# Patient Record
Sex: Female | Born: 1960 | Race: White | Hispanic: No | Marital: Married | Smoking: Never smoker
Health system: Southern US, Community
[De-identification: ages and names within clinical notes are randomized; demographics above are authoritative.]

## PROBLEM LIST (undated history)

## (undated) DIAGNOSIS — M1990 Unspecified osteoarthritis, unspecified site: Secondary | ICD-10-CM

## (undated) DIAGNOSIS — I10 Essential (primary) hypertension: Secondary | ICD-10-CM

## (undated) DIAGNOSIS — K219 Gastro-esophageal reflux disease without esophagitis: Secondary | ICD-10-CM

## (undated) DIAGNOSIS — Z9889 Other specified postprocedural states: Secondary | ICD-10-CM

## (undated) DIAGNOSIS — G43909 Migraine, unspecified, not intractable, without status migrainosus: Secondary | ICD-10-CM

## (undated) DIAGNOSIS — G4730 Sleep apnea, unspecified: Secondary | ICD-10-CM

## (undated) DIAGNOSIS — R112 Nausea with vomiting, unspecified: Secondary | ICD-10-CM

## (undated) HISTORY — PX: SHOULDER ARTHROSCOPY: SHX128

## (undated) HISTORY — PX: CHOLECYSTECTOMY: SHX55

## (undated) HISTORY — PX: ABDOMINAL HYSTERECTOMY: SHX81

## (undated) HISTORY — PX: OTHER SURGICAL HISTORY: SHX169

## (undated) HISTORY — DX: Essential (primary) hypertension: I10

---

## 2001-12-21 ENCOUNTER — Encounter: Admission: RE | Admit: 2001-12-21 | Discharge: 2002-02-09 | Payer: Self-pay | Admitting: Anesthesiology

## 2002-02-15 ENCOUNTER — Encounter: Admission: RE | Admit: 2002-02-15 | Discharge: 2002-05-16 | Payer: Self-pay | Admitting: Anesthesiology

## 2011-05-14 HISTORY — PX: NASAL POLYP SURGERY: SHX186

## 2016-08-26 ENCOUNTER — Other Ambulatory Visit (HOSPITAL_COMMUNITY): Payer: Self-pay | Admitting: General Surgery

## 2016-08-27 ENCOUNTER — Other Ambulatory Visit (HOSPITAL_COMMUNITY): Payer: Self-pay

## 2016-08-27 ENCOUNTER — Ambulatory Visit: Payer: Self-pay | Admitting: Registered"

## 2016-09-11 ENCOUNTER — Encounter: Payer: BC Managed Care – PPO | Attending: General Surgery | Admitting: Registered"

## 2016-09-11 ENCOUNTER — Encounter: Payer: Self-pay | Admitting: Registered"

## 2016-09-11 DIAGNOSIS — Z713 Dietary counseling and surveillance: Secondary | ICD-10-CM | POA: Insufficient documentation

## 2016-09-11 DIAGNOSIS — E669 Obesity, unspecified: Secondary | ICD-10-CM

## 2016-09-11 DIAGNOSIS — Z01818 Encounter for other preprocedural examination: Secondary | ICD-10-CM | POA: Diagnosis not present

## 2016-09-11 DIAGNOSIS — Z6841 Body Mass Index (BMI) 40.0 and over, adult: Secondary | ICD-10-CM | POA: Diagnosis not present

## 2016-09-11 NOTE — Progress Notes (Signed)
Pre-Op Assessment Visit:  Pre-Operative RYGB Surgery  Medical Nutrition Therapy:  Appt start time: 2:05  End time: 3:15  Patient was seen on 09/11/2016 for Pre-Operative Nutrition Assessment. Assessment.   Pt expectation of surgery: live longer, improve joint pain, feel better  Pt expectation of Dietitian: none indicated  Start weight at [facility]: 238.2 BMI: 42.87   Pt states she has IBS issues which makes her nervous about "messing with intestine" related to RYGB and would prefer sleeve gastrectomy. Pt states she is able to go up to 4 days without taking meds for reflux and would prefer to just take meds if it worsens with sleeve gastrectomy. Pt states she is more regular and not having much constipation when she takes reflux meds. Pt states she has a grandson (born in Aug) that stays with her and wants to live longer for him. Pt reports she shares a bowl of popcorn with husband nightly while watching the news. Pt reports having family dinner once a week where she hosts and prepares the food. Pt reports concern of how husband and herself will handle eating different things post-surgery. Pt reports her knees and feet hurt when walking a lot.  Pt is unsure of how many visits needed prior to surgery. Assessment letter needs to be completed.    24 hr Dietary Recall: First Meal: 2 boiled eggs, 2 bacon strips, oatmeal with raisins Snack: apple Second Meal: chicken nuggets, rice, leftovers Snack: apple and peanut butter, banana Third Meal: roast with vegetables, macaroni, potatoes Snack: popcorn, crackers Beverages: flavored water, sweet tea (once a week), coffee with creamer  Encouraged to engage in 150 minutes of moderate physical activity including cardiovascular and weight baring weekly  Handouts given during visit include:  . Pre-Op Goals . Bariatric Surgery Protein Shakes . Vitamin and Mineral Supplement  During the appointment today the following Pre-Op Goals were reviewed with the  patient: . Maintain or lose weight as instructed by your surgeon . Make healthy food choices . Begin to limit portion sizes . Limited concentrated sugars and fried foods . Keep fat/sugar in the single digits per serving on          food labels . Practice CHEWING your food  (aim for 30 chews per bite or until applesauce consistency) . Practice not drinking 15 minutes before, during, and 30 minutes after each meal/snack . Avoid all carbonated beverages  . Avoid/limit caffeinated beverages  . Avoid all sugar-sweetened beverages . Consume 3 meals per day; eat every 3-5 hours . Make a list of non-food related activities . Aim for 64-100 ounces of FLUID daily  . Aim for at least 60-80 grams of PROTEIN daily . Look for a liquid protein source that contain ?15 g protein and ?5 g carbohydrate  (ex: shakes, drinks, shots)  Follow diet recommendations listed below  Energy and Macronutrient Recomendations: Calories: 1500 Carbohydrate: 170 Protein: 112 Fat: 42  Demonstrated degree of understanding via:  Teach Back   Teaching Method Utilized:  Visual Auditory Hands on  Barriers to learning/adherence to lifestyle change: none  Patient to call the [facility] to enroll in Pre-Op and Post-Op Nutrition Education when surgery date is scheduled.

## 2016-09-27 ENCOUNTER — Other Ambulatory Visit (HOSPITAL_COMMUNITY): Payer: Self-pay | Admitting: General Surgery

## 2016-10-04 ENCOUNTER — Ambulatory Visit (HOSPITAL_COMMUNITY)
Admission: RE | Admit: 2016-10-04 | Discharge: 2016-10-04 | Disposition: A | Discharge status: Home / Self Care | Payer: BC Managed Care – PPO | Source: Ambulatory Visit | Attending: General Surgery | Admitting: General Surgery

## 2016-10-04 ENCOUNTER — Encounter (HOSPITAL_COMMUNITY): Payer: Self-pay | Admitting: Radiology

## 2016-10-04 DIAGNOSIS — Z01818 Encounter for other preprocedural examination: Secondary | ICD-10-CM | POA: Diagnosis present

## 2016-10-04 DIAGNOSIS — Z01810 Encounter for preprocedural cardiovascular examination: Secondary | ICD-10-CM | POA: Diagnosis not present

## 2016-10-14 ENCOUNTER — Encounter: Payer: Self-pay | Admitting: Registered"

## 2016-10-14 ENCOUNTER — Encounter: Payer: BC Managed Care – PPO | Attending: General Surgery | Admitting: Registered"

## 2016-10-14 DIAGNOSIS — Z6841 Body Mass Index (BMI) 40.0 and over, adult: Secondary | ICD-10-CM | POA: Insufficient documentation

## 2016-10-14 DIAGNOSIS — Z01818 Encounter for other preprocedural examination: Secondary | ICD-10-CM | POA: Diagnosis present

## 2016-10-14 DIAGNOSIS — Z713 Dietary counseling and surveillance: Secondary | ICD-10-CM | POA: Insufficient documentation

## 2016-10-14 DIAGNOSIS — E669 Obesity, unspecified: Secondary | ICD-10-CM

## 2016-10-14 NOTE — Patient Instructions (Addendum)
-   Aim for at least 30 minutes of physical activity 5 days/week.

## 2016-10-14 NOTE — Progress Notes (Signed)
Appt start time: 4:45 end time: 5:19  Assessment: 1st SWL Appointment.   Start Wt at [facility]: 238.2 Wt: 236.0 BMI: 42.48   Pt arrives having lost 2.2 lbs from previous visit. Pt states she has stopped taking Tramadol routinely and uses as needed. Pt states she has has switched to decaf coffee and has eliminated using sweeteners. Pt reports she has tried premier protein caramel with coffee and it is tolerable. Pt reports she has also tried chocolate and strawberry protein shakes. Pt states she is still unsure of if she wants to have the RYGB due to the involvement of her intestines and having history of IBS. Pt states she hopes she is able to speak with surgeon again prior to express her concerns.   Pt states that this is her only SWL visit needed with us prior to surgery. Assessment letter faxed today 10/14/2016.   MEDICATIONS: See list   DIETARY INTAKE:  24-hr recall:  B ( AM): 2 boiled eggs, 4 strips of bacon  Snk ( AM): fresh fruit, greek yogurt or protein shake L ( PM): chicken nuggets and salad w/ lite dressing Snk ( PM): none D ( PM): pizza or hotdog w/o bun or meat and potatoes Snk ( PM): popcorn  Beverages: decaf coffee, water w/ replenish flavor  Usual physical activity: Walking 3-4x/week with grandson  Diet to Follow: Calories: 1500 Carbohydrate: 170 Protein: 112 Fat: 42  Preferred Learning Style:   No preference indicated   Learning Readiness:   Ready  Change in progress   Nutritional Diagnosis:  [place]-3.3 Overweight/obesity related to past poor dietary habits and physical inactivity as evidenced by patient w/ planned RYGB surgery following dietary guidelines for continued weight loss.    Intervention:  Nutrition counseling for upcoming Bariatric Surgery.  Goals:  - Aim for at least 30 minutes of physical activity 5 days/week.  Teaching Method Utilized:  Visual Auditory  Handouts given during visit include:  Arm exercises  Barriers to  learning/adherence to lifestyle change: none  Demonstrated degree of understanding via:  Teach Back   Monitoring/Evaluation:  Dietary intake, exercise, and body weight prn.

## 2016-11-04 ENCOUNTER — Encounter: Payer: BC Managed Care – PPO | Admitting: Skilled Nursing Facility1

## 2016-11-04 DIAGNOSIS — Z01818 Encounter for other preprocedural examination: Secondary | ICD-10-CM | POA: Diagnosis not present

## 2016-11-04 DIAGNOSIS — E119 Type 2 diabetes mellitus without complications: Secondary | ICD-10-CM

## 2016-11-06 NOTE — Progress Notes (Signed)
  Pre-Operative Nutrition Class:  Appt start time: 1583   End time:  1830.  Patient was seen on 11/04/16 for Pre-Operative Bariatric Surgery Education [facility].   Surgery date:  Surgery type: RYGB Start weight at [facility]: 238.2 Weight today: Pt denied  TANITA  BODY COMP RESULTS     BMI (kg/m^2)    Fat Mass (lbs)    Fat Free Mass (lbs)    Total Body Water (lbs)    Samples given per MNT protocol. Patient educated on appropriate usage: Bariatric Advantage Multivitamin Lot # [id number] Exp: 06/19  Bariatric Fusion Calcium Citrate Lot # [id number] Exp: 09/11/17  Premier Clear Protein Drink Lot # [id number] Exp: 04/08/17  The following the learning objectives were met by the patient during this course:  Identify Pre-Op Dietary Goals and will begin 2 weeks pre-operatively  Identify appropriate sources of fluids and proteins   State protein recommendations and appropriate sources pre and post-operatively  Identify Post-Operative Dietary Goals and will follow for 2 weeks post-operatively  Identify appropriate multivitamin and calcium sources  Describe the need for physical activity post-operatively and will follow MD recommendations  State when to call healthcare provider regarding medication questions or post-operative complications  Handouts given during class include:  Pre-Op Bariatric Surgery Diet Handout  Protein Shake Handout  Post-Op Bariatric Surgery Nutrition Handout  BELT Program Information Flyer  Support Group Information Flyer  [facility] Bariatric Supplements Price List  Follow-Up Plan: Patient will follow-up at [facility] 2 weeks post operatively for diet advancement per MD.

## 2016-11-19 ENCOUNTER — Telehealth: Payer: Self-pay | Admitting: Skilled Nursing Facility1

## 2016-11-20 ENCOUNTER — Encounter: Payer: Self-pay | Admitting: Skilled Nursing Facility1

## 2016-11-20 ENCOUNTER — Encounter: Payer: BC Managed Care – PPO | Attending: General Surgery | Admitting: Skilled Nursing Facility1

## 2016-11-20 DIAGNOSIS — Z6841 Body Mass Index (BMI) 40.0 and over, adult: Secondary | ICD-10-CM | POA: Diagnosis not present

## 2016-11-20 DIAGNOSIS — E669 Obesity, unspecified: Secondary | ICD-10-CM

## 2016-11-20 DIAGNOSIS — Z713 Dietary counseling and surveillance: Secondary | ICD-10-CM | POA: Insufficient documentation

## 2016-11-20 DIAGNOSIS — Z01818 Encounter for other preprocedural examination: Secondary | ICD-10-CM | POA: Diagnosis present

## 2016-11-20 NOTE — Progress Notes (Signed)
Appt start time: 4:45 end time: 5:19  Assessment: 2nd SWL Appointment.  Pt arrives having lost about 3 pounds since her last visit. Pt arrives with her daughter and her grandson who she states she is having surgery for. Pt states she has started taking the celebrate multivitamin. Pt states she has tried different protein shakes and got the [employer] protein water to try. Pt states she has been reading the weight loss for dummies book. Pt states her preparing for the surgery and getting the surgery is a benefit for the whole family and states her husband has high tryliglycerdies so he will benefit from the changes she has to make for surgery. Pt states she would not have gone through with getting the surgery if she did not have support from her husband which she does. Pts behavior changes: cut out caffiene, walking more, watching her portions sizes, cooking healthier. Pt states she is mentally prepared and emotionally ready to have bariatric surgery.  Pts daughter and the pt were able to teach the dietitian about the lifestyle habits that need to take place in order to have positive outcomes after the surgery.  After meeting with the pt dietitian is confident the pt will reach her weight loss goals in a healthy and sustainable way once her weight loss surgery has been conducted.   [facility] at [facility]: 238.2 Weight today: 233   MEDICATIONS: See list   DIETARY INTAKE:  24-hr recall:  B ( AM): 2 boiled eggs, 4 strips of bacon  Snk ( AM): fresh fruit, greek yogurt or protein shake L ( PM): chicken nuggets and salad w/ lite dressing Snk ( PM): none D ( PM): pizza or hotdog w/o bun or meat and potatoes Snk ( PM): popcorn  Beverages: decaf coffee, water w/ replenish flavor  Usual physical activity: Walking 3-4x/week with grandson  Diet to Follow: Calories: 1500 Carbohydrate: 170 Protein: 112 Fat: 42  Preferred Learning Style:   No preference indicated   Learning Readiness:    Ready  Change in progress   Nutritional Diagnosis:  [place]-3.3 Overweight/obesity related to past poor dietary habits and physical inactivity as evidenced by patient w/ planned RYGB surgery following dietary guidelines for continued weight loss.    Intervention:  Nutrition counseling for upcoming Bariatric Surgery.  Goals:  - Aim for at least 30 minutes of physical activity 5 days/week.  Teaching Method Utilized:  Visual Auditory  Handouts given during visit include:  Arm exercises  Barriers to learning/adherence to lifestyle change: none  Demonstrated degree of understanding via:  Teach Back   Monitoring/Evaluation:  Dietary intake, exercise, and body weight prn.

## 2016-11-20 NOTE — Telephone Encounter (Signed)
Done in error.

## 2016-12-03 ENCOUNTER — Encounter (HOSPITAL_COMMUNITY): Payer: Self-pay

## 2016-12-03 NOTE — Progress Notes (Signed)
Please place orders in epic, patient has pre-surgical testing appt on 12-04-16. Thank you

## 2016-12-03 NOTE — Patient Instructions (Signed)
Tobie Poeteresa Ogden Ninh  12/03/2016   Your procedure is scheduled on: 12-10-16  [facility]  Entrance [place]  elevators to 3rd floor to  Short Stay Center at 1015AM.    Call this number if you have problems the morning of surgery [phone]    Remember: ONLY 1 PERSON MAY GO WITH YOU TO SHORT STAY TO GET  READY MORNING OF YOUR SURGERY.  Do not eat food or drink liquids :After Midnight.     Take these medicines the morning of surgery with A SIP OF WATER: omeprazole(prilosec)                                You may not have any metal on your body including hair pins and              piercings  Do not wear jewelry, make-up, lotions, powders or perfumes, deodorant             Do not wear nail polish.  Do not shave  48 hours prior to surgery.            Do not bring valuables to the hospital. [facility] IS NOT             RESPONSIBLE   FOR VALUABLES.  Contacts, dentures or bridgework may not be worn into surgery.  Leave suitcase in the car. After surgery it may be brought to your room.                Please read over the following fact sheets you were given: _____________________________________________________________________           [facility] - Preparing for Surgery Before surgery, you can play an important role.  Because skin is not sterile, your skin needs to be as free of germs as possible.  You can reduce the number of germs on your skin by washing with CHG (chlorahexidine gluconate) soap before surgery.  CHG is an antiseptic cleaner which kills germs and bonds with the skin to continue killing germs even after washing. Please DO NOT use if you have an allergy to CHG or antibacterial soaps.  If your skin becomes reddened/irritated stop using the CHG and inform your nurse when you arrive at Short Stay. Do not shave (including legs and underarms) for at least 48 hours prior to the first CHG shower.  You may shave your face/neck. Please follow  these instructions carefully:  1.  Shower with CHG Soap the night before surgery and the  morning of Surgery.  2.  If you choose to wash your hair, wash your hair first as usual with your  normal  shampoo.  3.  After you shampoo, rinse your hair and body thoroughly to remove the  shampoo.                           4.  Use CHG as you would any other liquid soap.  You can apply chg directly  to the skin and wash                       Gently with a scrungie or clean washcloth.  5.  Apply the CHG Soap to your body ONLY FROM THE NECK DOWN.   Do not  use on face/ open                           Wound or open sores. Avoid contact with eyes, ears mouth and genitals (private parts).                       Wash face,  Genitals (private parts) with your normal soap.             6.  Wash thoroughly, paying special attention to the area where your surgery  will be performed.  7.  Thoroughly rinse your body with warm water from the neck down.  8.  DO NOT shower/wash with your normal soap after using and rinsing off  the CHG Soap.                9.  Pat yourself dry with a clean towel.            10.  Wear clean pajamas.            11.  Place clean sheets on your bed the night of your first shower and do not  sleep with pets. Day of Surgery : Do not apply any lotions/deodorants the morning of surgery.  Please wear clean clothes to the hospital/surgery center.  FAILURE TO FOLLOW THESE INSTRUCTIONS MAY RESULT IN THE CANCELLATION OF YOUR SURGERY PATIENT SIGNATURE_________________________________  NURSE SIGNATURE__________________________________  ________________________________________________________________________

## 2016-12-03 NOTE — Progress Notes (Signed)
LOV Dr Haze RushingYbanez 10-28-16 epic (care everywhere) " Dr mentions awareness of upcoming bariatric surgery   EKG 10-04-16 epic   CXR 10-04-16 epic

## 2016-12-04 ENCOUNTER — Encounter (HOSPITAL_COMMUNITY)
Admission: RE | Admit: 2016-12-04 | Discharge: 2016-12-04 | Disposition: A | Discharge status: Home / Self Care | Payer: BC Managed Care – PPO | Source: Ambulatory Visit | Attending: General Surgery | Admitting: General Surgery

## 2016-12-04 ENCOUNTER — Encounter (HOSPITAL_COMMUNITY): Payer: Self-pay

## 2016-12-04 DIAGNOSIS — Z01818 Encounter for other preprocedural examination: Secondary | ICD-10-CM | POA: Insufficient documentation

## 2016-12-04 HISTORY — DX: Other specified postprocedural states: Z98.890

## 2016-12-04 HISTORY — DX: Gastro-esophageal reflux disease without esophagitis: K21.9

## 2016-12-04 HISTORY — DX: Migraine, unspecified, not intractable, without status migrainosus: G43.909

## 2016-12-04 HISTORY — DX: Sleep apnea, unspecified: G47.30

## 2016-12-04 HISTORY — DX: Unspecified osteoarthritis, unspecified site: M19.90

## 2016-12-04 HISTORY — DX: Nausea with vomiting, unspecified: R11.2

## 2016-12-04 LAB — CBC
HCT: 39.8 % (ref 36.0–46.0)
HEMOGLOBIN: 13.9 g/dL (ref 12.0–15.0)
MCH: 28.7 pg (ref 26.0–34.0)
MCHC: 34.9 g/dL (ref 30.0–36.0)
MCV: 82.2 fL (ref 78.0–100.0)
Platelets: 205 10*3/uL (ref 150–400)
RBC: 4.84 MIL/uL (ref 3.87–5.11)
RDW: 14.5 % (ref 11.5–15.5)
WBC: 7.2 10*3/uL (ref 4.0–10.5)

## 2016-12-04 LAB — BASIC METABOLIC PANEL
ANION GAP: 8 (ref 5–15)
BUN: 23 mg/dL — ABNORMAL HIGH (ref 6–20)
CALCIUM: 9.2 mg/dL (ref 8.9–10.3)
CHLORIDE: 101 mmol/L (ref 101–111)
CO2: 27 mmol/L (ref 22–32)
Creatinine, Ser: 0.65 mg/dL (ref 0.44–1.00)
GFR calc Af Amer: >60 mL/min (ref >60)
GFR calc non Af Amer: >60 mL/min (ref >60)
GLUCOSE: 89 mg/dL (ref 65–99)
Potassium: 3.7 mmol/L (ref 3.5–5.1)
Sodium: 136 mmol/L (ref 135–145)

## 2016-12-09 NOTE — H&P (Signed)
History of Present Illness Savannah Zamora(Rhodes Calvert A. Hera Celaya MD; 11/21/2016 1:40 PM) The patient is a 56 year old female who presents for a bariatric surgery evaluation. Initial presentation included inability to lose weight. Current diet includes well balanced meals (high protein breakfast with eggs or protein shake, grilled chicken with vegetable for lunch, water as only drink, she has eliminated straws, creamer, caffeine, and carbonation while working through this process.). a daily (takes care of 28mo old with frequent walking.). The patient is currently able to do activities of daily living with limitations (knee and ankle pain, she takes occasional NSAIDs and tylenol). Past treatment has included low calorie diet, low fat diet, diabetic diet and exercise regimen. Surgical history: C-section - elective, cholecystectomy and hysterectomy. Psychiatric History: The patient has depression (treated in the past, no active medications or issues), and patient denies bipolar disorder, anxiety or psychiatric hospitalizations. Cardiac history: The patient denies history of angina, history of heart attack, history of stroke, pulmonary embolus, sleep apnea, aspirin, anti-platelets, anti-coagulants or smoking. Gastrointestinal History: Patient has heartburn (on PPI with no breakthrough symptoms.), and denies dysphagia or irritable bowel disease. MBSQIP recognized comorbidities: Patient has hypertension (on 1 combo pill) and gastroesophageal reflux disease (on PPI), and denies hyperlipidemia, diabetes mellitus or sleep apnea. She has a very supportive spouse and daughter as well as good community of support. She also has been in touch with 2 friends who have undergone the procedure and has received a lot of support from them.   Allergies Christianne Dolin(Christen Lambert, RMA; 11/21/2016 11:22 AM) No Known Allergies 08/02/2016  Medication History Christianne Dolin(Christen Lambert, [place]; 11/21/2016 11:22 AM) Complete Multi-Vitamin (Oral) Active. (with  iron) LORazepam (1MG  Tablet, Oral) Active. Omeprazole (20MG  Capsule DR, Oral) Active. Valsartan-Hydrochlorothiazide (160-12.5MG  Tablet, Oral) Active. Rizatriptan Benzoate (10MG  Tablet, Oral) Active. Medications Reconciled    Review of Systems Savannah Zamora(Savannah Bontempo A. Uri Covey MD; 11/21/2016 1:40 PM) General Not Present- Appetite Loss, Chills, Fatigue, Fever, Night Sweats, Weight Gain and Weight Loss. Skin Present- Dryness. Not Present- Change in Wart/Mole, Hives, Jaundice, New Lesions, Non-Healing Wounds, Rash and Ulcer. HEENT Present- Wears glasses/contact lenses. Not Present- Earache, Hearing Loss, Hoarseness, Nose Bleed, Oral Ulcers, Ringing in the Ears, Seasonal Allergies, Sinus Pain, Sore Throat, Visual Disturbances and Yellow Eyes. Respiratory Present- Snoring. Not Present- Bloody sputum, Chronic Cough, Difficulty Breathing and Wheezing. Breast Not Present- Breast Mass, Breast Pain, Nipple Discharge and Skin Changes. Cardiovascular Not Present- Chest Pain, Difficulty Breathing Lying Down, Leg Cramps, Palpitations, Rapid Heart Rate, Shortness of Breath and Swelling of Extremities. Gastrointestinal Present- Indigestion. Not Present- Abdominal Pain, Bloating, Bloody Stool, Change in Bowel Habits, Chronic diarrhea, Constipation, Difficulty Swallowing, Excessive gas, Gets full quickly at meals, Hemorrhoids, Nausea, Rectal Pain and Vomiting. Female Genitourinary Not Present- Frequency, Nocturia, Painful Urination, Pelvic Pain and Urgency. Musculoskeletal Present- Back Pain, Joint Pain and Joint Stiffness. Not Present- Muscle Pain, Muscle Weakness and Swelling of Extremities. Neurological Present- Headaches. Not Present- Decreased Memory, Fainting, Numbness, Seizures, Tingling, Tremor, Trouble walking and Weakness. Psychiatric Present- Anxiety. Not Present- Bipolar, Change in Sleep Pattern, Depression, Fearful and Frequent crying. Endocrine Present- Hot flashes. Not Present- Cold Intolerance, Excessive  Hunger, Hair Changes, Heat Intolerance and New Diabetes. Hematology Not Present- Blood Thinners, Easy Bruising, Excessive bleeding, Gland problems, HIV and Persistent Infections.  Vitals Christianne Dolin(Christen Lambert RMA; 11/21/2016 11:23 AM) 11/21/2016 11:22 AM Weight: 233 lb Height: 64in Body Surface Area: 2.09 m Body Mass Index: 39.99 kg/m  Temp.: 107F  Pulse: 90 (Regular)  BP: 132/76 (Sitting, Left Arm, Standard)  Physical Exam Savannah Carl MD; 11/21/2016 1:40 PM) General Mental Status-Alert. General Appearance-Cooperative. Orientation-Oriented X4. Build & Nutrition-Obese. Posture-Normal posture.  Integumentary Global Assessment Normal Exam - Head/Face: no rashes, ulcers, lesions or evidence of photo damage. No palpable nodules or masses and Neck: no visible lesions or palpable masses.  Head and Neck Head-normocephalic, atraumatic with no lesions or palpable masses. Face Global Assessment - atraumatic. Thyroid Gland Characteristics - normal size and consistency.  Eye Eyeball - Bilateral-Extraocular movements intact. Sclera/Conjunctiva - Bilateral-No scleral icterus, No Discharge.  ENMT Nose and Sinuses Nose - no deformities observed, no swelling present.  Chest and Lung Exam Palpation Normal exam - Non-tender. Auscultation Breath sounds - Normal.  Cardiovascular Auscultation Rhythm - Regular. Heart Sounds - S1 WNL and S2 WNL. Carotid arteries - No Carotid bruit.  Abdomen Inspection Normal Exam - No Visible peristalsis, No Abnormal pulsations and No Paradoxical movements. Palpation/Percussion Normal exam - Soft, Non Tender, No Rebound tenderness, No Rigidity (guarding), No hepatosplenomegaly and No Palpable abdominal masses. Note: well healed laparoscopic scars   Peripheral Vascular Upper Extremity Palpation - Pulses bilaterally normal. Lower Extremity Palpation - Edema - Bilateral - No edema.  Neurologic Neurologic  evaluation reveals -normal sensation and normal coordination.  Neuropsychiatric Mental status exam performed with findings of-able to articulate well with normal speech/language, rate, volume and coherence and thought content normal with ability to perform basic computations and apply abstract reasoning.  Musculoskeletal Normal Exam - Bilateral-Upper Extremity Strength Normal and Lower Extremity Strength Normal.    Assessment & Plan Savannah Carl MD; 11/21/2016 1:46 PM) MORBID OBESITY (E66.01) Story: 56 yo female with morbid obesity as well as obstructive sleep apnea, hypertension and GERD. SHe is most interested in a sleeve gastrectomy. I think given her reflux maintained on PPI and positive hiatal hernia on UGI that this is an ideal procedure for her and we will repair the hiatal hernia at time of surgery. She has shown marked improvement of diet in the preoperative phase and is ready to proceed with surgery. Impression: We discussed LSG. We discussed the preoperative, operative and postoperative process. I explained the surgery in detail including 6 small incisions, ligation of the greater curve gastric vessels, placement of 40Fr Bougie and stapling 80% of the volume of the stomach and paying attention to the angularis incisura to maintain ideal postoperative anatomy and the performance of an EGD near the end of the surgery to test for leak. We discussed the typical hospital course including a 1-2 day stay baring any complications. The patient was given educational material. I quoted the patient that most patients can lose up to 50-70% of their excess weight. We did discuss the possibility of weight regain several years after the procedure.  The risks of infection, bleeding, pain, scarring, weight regain, too little or too much weight loss, vitamin deficiencies and need for lifelong vitamin supplementation, hair loss, need for protein supplementation, leaks, stricture, reflux, food  intolerance, gallstone formation, hernia, need for reoperation, need for open surgery, injury to spleen or surrounding structures, DVT's, PE, and death again discussed with the patient and the patient expressed understanding and desires to proceed with laparoscopic sleeve gastrectomy, possible open, intraoperative endoscopy.  We discussed that before and after surgery that there would be an alteration in their diet. I explained that we have put them on a diet 2 weeks before surgery. I also explained that they would be on a liquid diet for 2 weeks after surgery. We discussed that they would have  to avoid certain foods after surgery. We discussed the importance of physical activity as well as compliance with our dietary and supplement recommendations and routine follow-up. HYPERTENSION, BENIGN (I10) Impression: on 1 combo med GASTROESOPHAGEAL REFLUX DISEASE, ESOPHAGITIS PRESENCE NOT SPECIFIED (K21.9) Impression: on PPI with no breakthrough symptoms

## 2016-12-09 NOTE — Progress Notes (Signed)
Called [facility] triage desk requesting orders for 12/10/16 surgery

## 2016-12-10 ENCOUNTER — Inpatient Hospital Stay (HOSPITAL_COMMUNITY): Payer: BC Managed Care – PPO | Admitting: Certified Registered Nurse Anesthetist

## 2016-12-10 ENCOUNTER — Encounter (HOSPITAL_COMMUNITY)
Admission: RE | Disposition: A | Discharge status: Home / Self Care | Payer: Self-pay | Source: Ambulatory Visit | Attending: General Surgery

## 2016-12-10 ENCOUNTER — Encounter (HOSPITAL_COMMUNITY): Payer: Self-pay | Admitting: *Deleted

## 2016-12-10 ENCOUNTER — Inpatient Hospital Stay (HOSPITAL_COMMUNITY)
Admission: RE | Admit: 2016-12-10 | Discharge: 2016-12-11 | DRG: 621 | Disposition: A | Discharge status: Home / Self Care | Payer: BC Managed Care – PPO | Source: Ambulatory Visit | Attending: General Surgery | Admitting: General Surgery

## 2016-12-10 DIAGNOSIS — K210 Gastro-esophageal reflux disease with esophagitis: Secondary | ICD-10-CM | POA: Diagnosis present

## 2016-12-10 DIAGNOSIS — F329 Major depressive disorder, single episode, unspecified: Secondary | ICD-10-CM | POA: Diagnosis present

## 2016-12-10 DIAGNOSIS — K449 Diaphragmatic hernia without obstruction or gangrene: Secondary | ICD-10-CM | POA: Diagnosis present

## 2016-12-10 DIAGNOSIS — R12 Heartburn: Secondary | ICD-10-CM | POA: Diagnosis present

## 2016-12-10 DIAGNOSIS — G4733 Obstructive sleep apnea (adult) (pediatric): Secondary | ICD-10-CM | POA: Diagnosis present

## 2016-12-10 DIAGNOSIS — Z6841 Body Mass Index (BMI) 40.0 and over, adult: Secondary | ICD-10-CM | POA: Diagnosis not present

## 2016-12-10 DIAGNOSIS — I10 Essential (primary) hypertension: Secondary | ICD-10-CM | POA: Diagnosis present

## 2016-12-10 HISTORY — PX: LAPAROSCOPIC GASTRIC SLEEVE RESECTION: SHX5895

## 2016-12-10 LAB — CBC
HEMATOCRIT: 39.4 % (ref 36.0–46.0)
HEMOGLOBIN: 14.2 g/dL (ref 12.0–15.0)
MCH: 29 pg (ref 26.0–34.0)
MCHC: 36 g/dL (ref 30.0–36.0)
MCV: 80.4 fL (ref 78.0–100.0)
Platelets: 183 10*3/uL (ref 150–400)
RBC: 4.9 MIL/uL (ref 3.87–5.11)
RDW: 14.5 % (ref 11.5–15.5)
WBC: 11.6 10*3/uL — ABNORMAL HIGH (ref 4.0–10.5)

## 2016-12-10 LAB — CREATININE, SERUM
CREATININE: 0.72 mg/dL (ref 0.44–1.00)
GFR calc Af Amer: >60 mL/min (ref >60)
GFR calc non Af Amer: >60 mL/min (ref >60)

## 2016-12-10 LAB — HEMOGLOBIN AND HEMATOCRIT, BLOOD
HCT: 38.1 % (ref 36.0–46.0)
HEMOGLOBIN: 13.3 g/dL (ref 12.0–15.0)

## 2016-12-10 SURGERY — GASTRECTOMY, SLEEVE, LAPAROSCOPIC
Anesthesia: General | Site: Abdomen

## 2016-12-10 MED ORDER — PROMETHAZINE HCL 25 MG/ML IJ SOLN
INTRAMUSCULAR | Status: AC
  Filled 2016-12-10: qty 1

## 2016-12-10 MED ORDER — SUGAMMADEX SODIUM 500 MG/5ML IV SOLN
INTRAVENOUS | Status: AC
  Filled 2016-12-10: qty 5

## 2016-12-10 MED ORDER — MIDAZOLAM HCL 5 MG/5ML IJ SOLN
INTRAMUSCULAR | Status: DC | PRN
  Administered 2016-12-10: 2 mg via INTRAVENOUS

## 2016-12-10 MED ORDER — HYDROMORPHONE HCL-NACL 0.5-0.9 MG/ML-% IV SOSY
PREFILLED_SYRINGE | INTRAVENOUS | Status: AC
  Filled 2016-12-10: qty 2

## 2016-12-10 MED ORDER — PROPOFOL 10 MG/ML IV BOLUS
INTRAVENOUS | Status: AC
  Filled 2016-12-10: qty 40

## 2016-12-10 MED ORDER — SUGAMMADEX SODIUM 200 MG/2ML IV SOLN
INTRAVENOUS | Status: DC | PRN
  Administered 2016-12-10: 300 mg via INTRAVENOUS

## 2016-12-10 MED ORDER — DEXAMETHASONE SODIUM PHOSPHATE 4 MG/ML IJ SOLN: 4.0000 mg | INTRAMUSCULAR | Status: DC

## 2016-12-10 MED ORDER — KETAMINE HCL 10 MG/ML IJ SOLN
INTRAMUSCULAR | Status: DC | PRN
  Administered 2016-12-10: 25 mg via INTRAVENOUS

## 2016-12-10 MED ORDER — FENTANYL CITRATE (PF) 250 MCG/5ML IJ SOLN
INTRAMUSCULAR | Status: AC
  Filled 2016-12-10: qty 5

## 2016-12-10 MED ORDER — APREPITANT 40 MG PO CAPS: 40.0000 mg | ORAL_CAPSULE | ORAL | Status: DC

## 2016-12-10 MED ORDER — BUPIVACAINE HCL (PF) 0.25 % IJ SOLN
INTRAMUSCULAR | Status: AC
  Filled 2016-12-10: qty 30

## 2016-12-10 MED ORDER — APREPITANT 40 MG PO CAPS
40.0000 mg | ORAL_CAPSULE | ORAL | Status: AC
  Administered 2016-12-10: 40 mg via ORAL
  Filled 2016-12-10: qty 1

## 2016-12-10 MED ORDER — HEPARIN SODIUM (PORCINE) 5000 UNIT/ML IJ SOLN
5000.0000 [IU] | INTRAMUSCULAR | Status: AC
  Administered 2016-12-10: 5000 [IU] via SUBCUTANEOUS
  Filled 2016-12-10: qty 1

## 2016-12-10 MED ORDER — HYDRALAZINE HCL 20 MG/ML IJ SOLN
10.0000 mg | INTRAMUSCULAR | Status: DC | PRN
  Filled 2016-12-10: qty 0.5

## 2016-12-10 MED ORDER — FENTANYL CITRATE (PF) 100 MCG/2ML IJ SOLN: 25.0000 ug | INTRAMUSCULAR | Status: DC | PRN

## 2016-12-10 MED ORDER — LIDOCAINE 2% (20 MG/ML) 5 ML SYRINGE
INTRAMUSCULAR | Status: AC
  Filled 2016-12-10: qty 10

## 2016-12-10 MED ORDER — MIDAZOLAM HCL 2 MG/2ML IJ SOLN
INTRAMUSCULAR | Status: AC
  Filled 2016-12-10: qty 2

## 2016-12-10 MED ORDER — CEFOTETAN DISODIUM-DEXTROSE 2-2.08 GM-% IV SOLR
2.0000 g | INTRAVENOUS | Status: AC
  Administered 2016-12-10: 2 g via INTRAVENOUS
  Filled 2016-12-10: qty 50

## 2016-12-10 MED ORDER — ACETAMINOPHEN 500 MG PO TABS
1000.0000 mg | ORAL_TABLET | ORAL | Status: AC
  Administered 2016-12-10: 1000 mg via ORAL
  Filled 2016-12-10: qty 2

## 2016-12-10 MED ORDER — ROCURONIUM BROMIDE 50 MG/5ML IV SOSY
PREFILLED_SYRINGE | INTRAVENOUS | Status: AC
  Filled 2016-12-10: qty 5

## 2016-12-10 MED ORDER — LIDOCAINE 2% (20 MG/ML) 5 ML SYRINGE
INTRAMUSCULAR | Status: DC | PRN
  Administered 2016-12-10: 100 mg via INTRAVENOUS

## 2016-12-10 MED ORDER — SODIUM CHLORIDE 0.9 % IV SOLN
INTRAVENOUS | Status: DC
  Administered 2016-12-10 – 2016-12-11 (×2): via INTRAVENOUS

## 2016-12-10 MED ORDER — GABAPENTIN 300 MG PO CAPS
300.0000 mg | ORAL_CAPSULE | ORAL | Status: AC
Start: 2016-12-10 — End: 2016-12-10
  Administered 2016-12-10: 300 mg via ORAL
  Filled 2016-12-10: qty 1

## 2016-12-10 MED ORDER — ENOXAPARIN SODIUM 30 MG/0.3ML ~~LOC~~ SOLN: 30.0000 mg | Freq: Two times a day (BID) | SUBCUTANEOUS | Status: DC

## 2016-12-10 MED ORDER — PROMETHAZINE HCL 25 MG/ML IJ SOLN
6.2500 mg | INTRAMUSCULAR | Status: DC | PRN
  Administered 2016-12-10: 6.25 mg via INTRAVENOUS

## 2016-12-10 MED ORDER — CHLORHEXIDINE GLUCONATE 4 % EX LIQD: 60.0000 mL | Freq: Once | CUTANEOUS | Status: DC

## 2016-12-10 MED ORDER — LACTATED RINGERS IR SOLN
Status: DC | PRN
  Administered 2016-12-10: 1000 mL

## 2016-12-10 MED ORDER — LACTATED RINGERS IV SOLN
INTRAVENOUS | Status: DC
  Administered 2016-12-10 (×2): via INTRAVENOUS

## 2016-12-10 MED ORDER — EPHEDRINE SULFATE 50 MG/ML IJ SOLN
INTRAMUSCULAR | Status: DC | PRN
  Administered 2016-12-10: 10 mg via INTRAVENOUS
  Administered 2016-12-10: 15 mg via INTRAVENOUS
  Administered 2016-12-10: 10 mg via INTRAVENOUS

## 2016-12-10 MED ORDER — OXYCODONE HCL 5 MG/5ML PO SOLN
5.0000 mg | ORAL | Status: DC | PRN
  Administered 2016-12-11: 10 mg via ORAL
  Administered 2016-12-11: 5 mg via ORAL
  Filled 2016-12-10: qty 10
  Filled 2016-12-10: qty 5

## 2016-12-10 MED ORDER — ACETAMINOPHEN 325 MG PO TABS: 650.0000 mg | ORAL_TABLET | ORAL | Status: DC | PRN

## 2016-12-10 MED ORDER — HYDROMORPHONE HCL-NACL 0.5-0.9 MG/ML-% IV SOSY
0.2500 mg | PREFILLED_SYRINGE | INTRAVENOUS | Status: DC | PRN
  Administered 2016-12-10 (×2): 0.5 mg via INTRAVENOUS

## 2016-12-10 MED ORDER — ONDANSETRON HCL 4 MG/2ML IJ SOLN
INTRAMUSCULAR | Status: AC
  Filled 2016-12-10: qty 2

## 2016-12-10 MED ORDER — SIMETHICONE 80 MG PO CHEW
80.0000 mg | CHEWABLE_TABLET | Freq: Four times a day (QID) | ORAL | Status: DC | PRN
  Administered 2016-12-11: 80 mg via ORAL
  Filled 2016-12-10: qty 1

## 2016-12-10 MED ORDER — FENTANYL CITRATE (PF) 250 MCG/5ML IJ SOLN
INTRAMUSCULAR | Status: DC | PRN
  Administered 2016-12-10 (×4): 50 ug via INTRAVENOUS

## 2016-12-10 MED ORDER — ACETAMINOPHEN 160 MG/5ML PO SOLN: 325.0000 mg | ORAL | Status: DC | PRN

## 2016-12-10 MED ORDER — LIDOCAINE 2% (20 MG/ML) 5 ML SYRINGE
INTRAMUSCULAR | Status: AC
  Filled 2016-12-10: qty 5

## 2016-12-10 MED ORDER — TRAMADOL HCL 50 MG PO TABS: 50.0000 mg | ORAL_TABLET | Freq: Four times a day (QID) | ORAL | Status: DC | PRN

## 2016-12-10 MED ORDER — BUPIVACAINE HCL 0.25 % IJ SOLN
INTRAMUSCULAR | Status: DC | PRN
  Administered 2016-12-10: 30 mL

## 2016-12-10 MED ORDER — ONDANSETRON HCL 4 MG/2ML IJ SOLN
INTRAMUSCULAR | Status: DC | PRN
  Administered 2016-12-10: 4 mg via INTRAVENOUS

## 2016-12-10 MED ORDER — BUPIVACAINE LIPOSOME 1.3 % IJ SUSP
20.0000 mL | Freq: Once | INTRAMUSCULAR | Status: DC
  Filled 2016-12-10: qty 20

## 2016-12-10 MED ORDER — EPHEDRINE 5 MG/ML INJ
INTRAVENOUS | Status: AC
  Filled 2016-12-10: qty 10

## 2016-12-10 MED ORDER — DEXAMETHASONE SODIUM PHOSPHATE 10 MG/ML IJ SOLN
INTRAMUSCULAR | Status: DC | PRN
  Administered 2016-12-10: 10 mg via INTRAVENOUS

## 2016-12-10 MED ORDER — PANTOPRAZOLE SODIUM 40 MG IV SOLR
40.0000 mg | Freq: Every day | INTRAVENOUS | Status: DC
  Administered 2016-12-10: 40 mg via INTRAVENOUS
  Filled 2016-12-10: qty 40

## 2016-12-10 MED ORDER — ONDANSETRON HCL 4 MG/2ML IJ SOLN
4.0000 mg | INTRAMUSCULAR | Status: DC | PRN
  Administered 2016-12-10 – 2016-12-11 (×4): 4 mg via INTRAVENOUS
  Filled 2016-12-10 (×4): qty 2

## 2016-12-10 MED ORDER — PREMIER PROTEIN SHAKE: 2.0000 [oz_av] | ORAL | Status: DC

## 2016-12-10 MED ORDER — SCOPOLAMINE 1 MG/3DAYS TD PT72
1.0000 | MEDICATED_PATCH | TRANSDERMAL | Status: DC
  Administered 2016-12-10: 1.5 mg via TRANSDERMAL
  Filled 2016-12-10: qty 1

## 2016-12-10 MED ORDER — BUPIVACAINE LIPOSOME 1.3 % IJ SUSP
INTRAMUSCULAR | Status: DC | PRN
  Administered 2016-12-10: 20 mL

## 2016-12-10 MED ORDER — ENOXAPARIN SODIUM 30 MG/0.3ML ~~LOC~~ SOLN
30.0000 mg | Freq: Two times a day (BID) | SUBCUTANEOUS | Status: DC
  Administered 2016-12-11: 30 mg via SUBCUTANEOUS
  Filled 2016-12-10: qty 0.3

## 2016-12-10 MED ORDER — SUCCINYLCHOLINE CHLORIDE 200 MG/10ML IV SOSY
PREFILLED_SYRINGE | INTRAVENOUS | Status: AC
  Filled 2016-12-10: qty 10

## 2016-12-10 MED ORDER — LIDOCAINE 2% (20 MG/ML) 5 ML SYRINGE
INTRAMUSCULAR | Status: DC | PRN
  Administered 2016-12-10: 1.5 mg/kg/h via INTRAVENOUS

## 2016-12-10 MED ORDER — DEXAMETHASONE SODIUM PHOSPHATE 10 MG/ML IJ SOLN
INTRAMUSCULAR | Status: AC
  Filled 2016-12-10: qty 1

## 2016-12-10 MED ORDER — MORPHINE SULFATE (PF) 2 MG/ML IV SOLN
1.0000 mg | INTRAVENOUS | Status: DC | PRN
  Administered 2016-12-10 (×3): 2 mg via INTRAVENOUS
  Filled 2016-12-10 (×3): qty 1

## 2016-12-10 MED ORDER — ROCURONIUM BROMIDE 50 MG/5ML IV SOSY
PREFILLED_SYRINGE | INTRAVENOUS | Status: DC | PRN
  Administered 2016-12-10: 50 mg via INTRAVENOUS

## 2016-12-10 MED ORDER — SUCCINYLCHOLINE CHLORIDE 200 MG/10ML IV SOSY
PREFILLED_SYRINGE | INTRAVENOUS | Status: DC | PRN
Start: 2016-12-10 — End: 2016-12-10
  Administered 2016-12-10: 100 mg via INTRAVENOUS

## 2016-12-10 MED ORDER — PROPOFOL 10 MG/ML IV BOLUS
INTRAVENOUS | Status: DC | PRN
  Administered 2016-12-10: 250 mg via INTRAVENOUS

## 2016-12-10 SURGICAL SUPPLY — 63 items
APL SKNCLS STERI-STRIP NONHPOA (GAUZE/BANDAGES/DRESSINGS) ×1
APPLIER CLIP 5 13 M/L LIGAMAX5 (MISCELLANEOUS) ×3
APPLIER CLIP ROT 13.4 12 LRG (CLIP)
APR CLP LRG 13.4X12 ROT 20 MLT (CLIP)
APR CLP MED LRG 5 ANG JAW (MISCELLANEOUS) ×1
BAG LAPAROSCOPIC 12 15 PORT 16 (BASKET) ×1 IMPLANT
BAG RETRIEVAL 12/15 (BASKET) ×2
BAG RETRIEVAL 12/15MM (BASKET) ×1
BANDAGE ADH SHEER 1  50/CT (GAUZE/BANDAGES/DRESSINGS) ×18 IMPLANT
BENZOIN TINCTURE PRP APPL 2/3 (GAUZE/BANDAGES/DRESSINGS) ×3 IMPLANT
BLADE SURG SZ11 CARB STEEL (BLADE) ×3 IMPLANT
CABLE HIGH FREQUENCY MONO STRZ (ELECTRODE) ×3 IMPLANT
CHLORAPREP W/TINT 26ML (MISCELLANEOUS) ×3 IMPLANT
CLIP APPLIE 5 13 M/L LIGAMAX5 (MISCELLANEOUS) IMPLANT
CLIP APPLIE ROT 13.4 12 LRG (CLIP) IMPLANT
CLOSURE WOUND 1/2 X4 (GAUZE/BANDAGES/DRESSINGS) ×1
COVER SURGICAL LIGHT HANDLE (MISCELLANEOUS) ×3 IMPLANT
DRAIN CHANNEL 19F RND (DRAIN) IMPLANT
ELECT REM PT RETURN 15FT ADLT (MISCELLANEOUS) ×3 IMPLANT
EVACUATOR SILICONE 100CC (DRAIN) IMPLANT
GAUZE SPONGE 4X4 12PLY STRL (GAUZE/BANDAGES/DRESSINGS) IMPLANT
GLOVE BIOGEL PI IND STRL 7.0 (GLOVE) ×1 IMPLANT
GLOVE BIOGEL PI INDICATOR 7.0 (GLOVE) ×2
GLOVE SURG SS PI 7.0 STRL IVOR (GLOVE) ×3 IMPLANT
GOWN STRL REUS W/TWL LRG LVL3 (GOWN DISPOSABLE) ×3 IMPLANT
GOWN STRL REUS W/TWL XL LVL3 (GOWN DISPOSABLE) ×9 IMPLANT
GRASPER SUT TROCAR 14GX15 (MISCELLANEOUS) ×3 IMPLANT
HANDLE STAPLE EGIA 4 XL (STAPLE) ×3 IMPLANT
HOVERMATT SINGLE USE (MISCELLANEOUS) ×3 IMPLANT
KIT BASIN OR (CUSTOM PROCEDURE TRAY) ×3 IMPLANT
MARKER SKIN DUAL TIP RULER LAB (MISCELLANEOUS) ×3 IMPLANT
NDL SPNL 22GX3.5 QUINCKE BK (NEEDLE) ×1 IMPLANT
NEEDLE SPNL 22GX3.5 QUINCKE BK (NEEDLE) ×3 IMPLANT
PACK CARDIOVASCULAR III (CUSTOM PROCEDURE TRAY) ×3 IMPLANT
RELOAD EGIA 45 MED/THCK PURPLE (STAPLE) ×2 IMPLANT
RELOAD EGIA 60 MED/THCK PURPLE (STAPLE) ×12 IMPLANT
RELOAD STAPLE 60 BLK XTHK ART (STAPLE) IMPLANT
RELOAD STAPLE 60 MED/THCK ART (STAPLE) IMPLANT
RELOAD TRI 2.0 60 XTHK VAS SUL (STAPLE) ×3 IMPLANT
RELOAD TRI 60 ART MED THCK BLK (STAPLE) IMPLANT
RELOAD TRI 60 ART MED THCK PUR (STAPLE) IMPLANT
SCISSORS LAP 5X45 EPIX DISP (ENDOMECHANICALS) ×2 IMPLANT
SET IRRIG TUBING LAPAROSCOPIC (IRRIGATION / IRRIGATOR) ×3 IMPLANT
SHEARS HARMONIC ACE PLUS 45CM (MISCELLANEOUS) ×3 IMPLANT
SLEEVE GASTRECTOMY 40FR VISIGI (MISCELLANEOUS) ×3 IMPLANT
SLEEVE XCEL OPT CAN 5 100 (ENDOMECHANICALS) ×6 IMPLANT
SOLUTION ANTI FOG 6CC (MISCELLANEOUS) ×3 IMPLANT
SPONGE LAP 18X18 X RAY DECT (DISPOSABLE) ×3 IMPLANT
STRIP CLOSURE SKIN 1/2X4 (GAUZE/BANDAGES/DRESSINGS) ×2 IMPLANT
SUT ETHIBOND 0 36 GRN (SUTURE) IMPLANT
SUT ETHILON 2 0 PS N (SUTURE) IMPLANT
SUT MNCRL AB 4-0 PS2 18 (SUTURE) ×5 IMPLANT
SUT VICRYL 0 TIES 12 18 (SUTURE) ×3 IMPLANT
SYR 20CC LL (SYRINGE) ×3 IMPLANT
SYR 50ML LL SCALE MARK (SYRINGE) ×3 IMPLANT
TOWEL OR 17X26 10 PK STRL BLUE (TOWEL DISPOSABLE) ×3 IMPLANT
TOWEL OR NON WOVEN STRL DISP B (DISPOSABLE) ×3 IMPLANT
TROCAR BLADELESS 15MM (ENDOMECHANICALS) ×3 IMPLANT
TROCAR BLADELESS OPT 5 100 (ENDOMECHANICALS) ×3 IMPLANT
TUBING CONNECTING 10 (TUBING) ×2 IMPLANT
TUBING CONNECTING 10' (TUBING) ×1
TUBING ENDO SMARTCAP (MISCELLANEOUS) ×3 IMPLANT
TUBING INSUF HEATED (TUBING) ×3 IMPLANT

## 2016-12-10 NOTE — Anesthesia Preprocedure Evaluation (Signed)
Anesthesia Evaluation  Patient identified by MRN, date of birth, ID band  Reviewed: Allergy & Precautions, NPO status , Patient's Chart, lab work & pertinent test results  History of Anesthesia Complications (+) PONV  Airway Mallampati: II  TM Distance: >3 FB     Dental   Pulmonary    breath sounds clear to auscultation       Cardiovascular hypertension,  Rhythm:Regular Rate:Normal     Neuro/Psych  Headaches,    GI/Hepatic Neg liver ROS, GERD  ,  Endo/Other  negative endocrine ROS  Renal/GU negative Renal ROS     Musculoskeletal  (+) Arthritis ,   Abdominal   Peds  Hematology   Anesthesia Other Findings   Reproductive/Obstetrics                             Anesthesia Physical Anesthesia Plan  ASA: III  Anesthesia Plan: General   Post-op Pain Management:    Induction: Intravenous  PONV Risk Score and Plan: 4 or greater and Ondansetron, Dexamethasone, Midazolam, Scopolamine patch - Pre-op and Propofol infusion  Airway Management Planned: Oral ETT  Additional Equipment:   Intra-op Plan:   Post-operative Plan: Possible Post-op intubation/ventilation  Informed Consent: I have reviewed the patients History and Physical, chart, labs and discussed the procedure including the risks, benefits and alternatives for the proposed anesthesia with the patient or authorized representative who has indicated his/her understanding and acceptance.   Dental advisory given  Plan Discussed with: CRNA and Anesthesiologist  Anesthesia Plan Comments:         Anesthesia Quick Evaluation

## 2016-12-10 NOTE — Discharge Instructions (Signed)

## 2016-12-10 NOTE — Interval H&P Note (Signed)
History and Physical Interval Note:  12/10/2016 12:38 PM  Savannah Zamora  has presented today for surgery, with the diagnosis of morbid obesity, hypertension, hiatal hernia, GERD  The various methods of treatment have been discussed with the patient and family. After consideration of risks, benefits and other options for treatment, the patient has consented to  Procedure(s): LAPAROSCOPIC GASTRIC SLEEVE RESECTION, UPPER ENDO, POSSIBLE HIATAL HERNIA (N/A) as a surgical intervention .  The patient's history has been reviewed, patient examined, no change in status, stable for surgery.  I have reviewed the patient's chart and labs.  Questions were answered to the patient's satisfaction.     De BlanchLuke Aaron Cheril Slattery

## 2016-12-10 NOTE — Transfer of Care (Signed)
Immediate Anesthesia Transfer of Care Note  Patient: Savannah Zamora  Procedure(s) Performed: Procedure(s): LAPAROSCOPIC GASTRIC SLEEVE RESECTION, UPPER ENDOSCOPY (N/A)  Patient Location: PACU  Anesthesia Type:General  Level of Consciousness:  sedated, patient cooperative and responds to stimulation  Airway & Oxygen Therapy:Patient Spontanous Breathing and Patient connected to face mask oxgen  Post-op Assessment:  Report given to PACU RN and Post -op Vital signs reviewed and stable  Post vital signs:  Reviewed and stable  Last Vitals:  Vitals:   12/10/16 1029  BP: (!) 142/63  Pulse: 71  Resp: 16  Temp: 36.7 C    Complications: No apparent anesthesia complications

## 2016-12-10 NOTE — Op Note (Signed)
**Note Savannah-Identified via Obfuscation** Preop Diagnosis: Obesity Class III  Postop Diagnosis: same  Procedure performed: laparoscopic Sleeve Gastrectomy  Assitant: Gaynelle AduEric Wilson  Indications:  The patient is a 56 y.o. year-old morbidly obese female who has been followed in the Bariatric Clinic as an outpatient. This patient was diagnosed with morbid obesity with a BMI of Body mass index is 40.2 kg/m. and significant co-morbidities including hypertension.  The patient was counseled extensively in the Bariatric Outpatient Clinic and after a thorough explanation of the risks and benefits of surgery (including death from complications, bowel leak, infection such as peritonitis and/or sepsis, internal hernia, bleeding, need for blood transfusion, bowel obstruction, organ failure, pulmonary embolus, deep venous thrombosis, wound infection, incisional hernia, skin breakdown, and others entailed on the consent form) and after a compliant diet and exercise program, the patient was scheduled for an elective laparoscopic sleeve gastrectomy.  Description of Operation:  Following informed consent, the patient was taken to the operating room and placed on the operating table in the supine position.  She had previously received prophylactic antibiotics and subcutaneous heparin for DVT prophylaxis in the pre-op holding area.  After induction of general endotracheal anesthesia by the anesthesiologist, the patient underwent placement of sequential compression devices, Foley catheter and an oro-gastric tube.  A timeout was confirmed by the surgery and anesthesia teams.  The patient was adequately padded at all pressure points and placed on a footboard to prevent slippage from the OR table during extremes of position during surgery.  She underwent a routine sterile prep and drape of her entire abdomen.    Next, A transverse incision was made under the left subcostal area and a 5mm optical viewing trocar was introduced into the peritoneal cavity. Pneumoperitoneum  was applied with a high flow and low pressure. A laparoscope was inserted to confirm placement. A extraperitoneal block was then placed at the lateral abdominal wall using exparel diluted with marcaine. 5 additional trocars were placed: 1 5mm trocar to the left of the midline. 1 additional 5mm trocar in the left lateral area, 1 12mm trocar in the right mid abdomen, and 1 5mm trocar in the right subcostal area.  The fat pad at the GE junction was incised and the gastrodiaphragmatic ligament was divided using the Harmonic scalpel. Next, a hole was created through the lesser omentum along the greater curve of the stomach to enter the lesser sac. The vessels along the greater omentum were  Then ligated and divided using the Harmonic scalpel moving towards the spleen and then short gastric vessels were ligated and divided in the same fashion to fully mobilize the fundus. The left crus was identified to ensure completion of the dissection. Next the antrum was measured and dissection continued inferiorly along the greater curve towards the pylorus and stopped 6cm from the pylorus.   A 40Fr ViSiGi dilator was placed into the esophgaus and along the lesser curve of the stomach and placed on suction. 1 non-reinforced 60mm 4-635mm tristapler(s) followed by 5 60mm and 1 45mm 3-414mm tristaplers were used to make the resection along the antrum being sure to stay well away from the angularis by angling the jaws of the stapler towards the greater curve and later completing the resection staying along the ViSiGi and ensuring the fundus was not retained by appropriately retracting it lateral. Air was inserted through the ViSiGi to perform a leak test showing no bubbles and a neutral lie of the stomach.  The assistant then went and performed an upper endoscopy and leak test.  No bubbles were seen and the sleeve and antrum distended appropriately. The specimen was then placed in an endocatch bag and removed by the 15mm port. The  fascia of the 15mm port was closed with a 0 vicryl by suture passer. Hemostasis was ensured with cautery and then 2 clips were used. One area along the staple line had a cautery burn not on the staple line. A 3-0 vicryl was used to imbricate the serosal over this area. Pneumoperitoneum was evacuated, all ports were removed and all incisions closed with 4-0 monocryl suture in subcuticular fashion. Steristrips and bandaids were put in place for dressing. The patient awoke from anesthesia and was brought to pacu in stable condition. All counts were correct.  Estimated blood loss: <2930ml  Specimens:  Sleeve gastrectomy  Local Anesthesia: 50 ml Exparel:0.5% Marcaine mix  Post-Op Plan:       Pain Management: PO, prn      Antibiotics: Prophylactic      Anticoagulation: Prophylactic, Starting now      Post Op Studies/Consults: Not applicable      Intended Discharge: within 48h      Intended Outpatient Follow-Up: Two Week      Intended Outpatient Studies: Not Applicable      Other: Not Applicable   Savannah Zamora Aaron Anya Murphey

## 2016-12-10 NOTE — Anesthesia Procedure Notes (Signed)
Procedure Name: Intubation Date/Time: 12/10/2016 12:40 PM Performed by: Maxwell Caul Pre-anesthesia Checklist: Patient identified, Emergency Drugs available, Suction available and Patient being monitored Patient Re-evaluated:Patient Re-evaluated prior to induction Oxygen Delivery Method: Circle system utilized Preoxygenation: Pre-oxygenation with 100% oxygen Induction Type: IV induction Ventilation: Mask ventilation without difficulty Laryngoscope Size: Mac and 4 Grade View: Grade I Tube type: Oral Tube size: 7.5 mm Number of attempts: 1 Airway Equipment and Method: Stylet Placement Confirmation: ETT inserted through vocal cords under direct vision,  positive ETCO2 and breath sounds checked- equal and bilateral Secured at: 21 cm Tube secured with: Tape Dental Injury: Teeth and Oropharynx as per pre-operative assessment

## 2016-12-10 NOTE — Op Note (Signed)
Savannah Zamora [id number] 1960/08/02 12/10/2016  Preoperative diagnosis: morbid obesity  Postoperative diagnosis: Same   Procedure: upper endoscopy   Surgeon: Mary SellaEric M. Caelynn Marshman M.D., FACS   Anesthesia: Gen.   Indications for procedure: 56 y.o. year old female undergoing Laparoscopic Gastric Sleeve Resection and an EGD was requested to evaluate the new gastric sleeve.   Description of procedure: After we have completed the sleeve resection, I scrubbed out and obtained the Olympus endoscope. I gently placed endoscope in the patient's oropharynx and gently glided it down the esophagus without any difficulty under direct visualization. Once I was in the gastric sleeve, I insufflated the stomach with air. I was able to cannulate and advanced the scope through the gastric sleeve. I was able to cannulate the duodenum with ease. Dr. Sheliah HatchKinsinger had placed saline in the upper abdomen. Upon further insufflation of the gastric sleeve there was no evidence of bubbles. GE junction located at 42 cm.  Upon further inspection of the gastric sleeve, the mucosa appeared normal. There is no evidence of any mucosal abnormality. The sleeve was widely patent at the angularis. There was no evidence of bleeding. The gastric sleeve was decompressed. The scope was withdrawn. The patient tolerated this portion of the procedure well. Please see Dr Guerry MinorsKinsinger's operative note for details regarding the laparoscopic gastric sleeve resection.   Mary SellaEric M. Andrey CampanileWilson, MD, FACS  General, Bariatric, & Minimally Invasive Surgery  [facility] [place]

## 2016-12-10 NOTE — Anesthesia Postprocedure Evaluation (Signed)
Anesthesia Post Note  Patient: Savannah Zamora  Procedure(s) Performed: Procedure(s) (LRB): LAPAROSCOPIC GASTRIC SLEEVE RESECTION, UPPER ENDOSCOPY (N/A)     Patient location during evaluation: PACU Anesthesia Type: General Level of consciousness: awake Pain management: pain level controlled Vital Signs Assessment: post-procedure vital signs reviewed and stable Respiratory status: spontaneous breathing Cardiovascular status: stable Anesthetic complications: no    Last Vitals:  Vitals:   12/10/16 1500 12/10/16 1515  BP: 130/76 134/77  Pulse: 85 88  Resp: 13 11  Temp:      Last Pain:  Vitals:   12/10/16 1515  TempSrc:   PainSc: 6                  Dontrez Pettis

## 2016-12-11 ENCOUNTER — Encounter (HOSPITAL_COMMUNITY): Payer: Self-pay | Admitting: General Surgery

## 2016-12-11 LAB — CBC WITH DIFFERENTIAL/PLATELET
Basophils Absolute: 0 10*3/uL (ref 0.0–0.1)
Basophils Relative: 0 %
EOS PCT: 0 %
Eosinophils Absolute: 0 10*3/uL (ref 0.0–0.7)
HCT: 39.3 % (ref 36.0–46.0)
Hemoglobin: 13.5 g/dL (ref 12.0–15.0)
LYMPHS ABS: 1.3 10*3/uL (ref 0.7–4.0)
LYMPHS PCT: 12 %
MCH: 28.3 pg (ref 26.0–34.0)
MCHC: 34.4 g/dL (ref 30.0–36.0)
MCV: 82.4 fL (ref 78.0–100.0)
Monocytes Absolute: 0.6 10*3/uL (ref 0.1–1.0)
Monocytes Relative: 5 %
Neutro Abs: 8.4 10*3/uL — ABNORMAL HIGH (ref 1.7–7.7)
Neutrophils Relative %: 83 %
PLATELETS: 227 10*3/uL (ref 150–400)
RBC: 4.77 MIL/uL (ref 3.87–5.11)
RDW: 14.7 % (ref 11.5–15.5)
WBC: 10.2 10*3/uL (ref 4.0–10.5)

## 2016-12-11 LAB — COMPREHENSIVE METABOLIC PANEL
ALK PHOS: 93 U/L (ref 38–126)
ALT: 31 U/L (ref 14–54)
AST: 33 U/L (ref 15–41)
Albumin: 3.8 g/dL (ref 3.5–5.0)
Anion gap: 9 (ref 5–15)
BILIRUBIN TOTAL: 0.5 mg/dL (ref 0.3–1.2)
BUN: 10 mg/dL (ref 6–20)
CALCIUM: 8.9 mg/dL (ref 8.9–10.3)
CHLORIDE: 103 mmol/L (ref 101–111)
CO2: 26 mmol/L (ref 22–32)
CREATININE: 0.72 mg/dL (ref 0.44–1.00)
Glucose, Bld: 113 mg/dL — ABNORMAL HIGH (ref 65–99)
Potassium: 4 mmol/L (ref 3.5–5.1)
Sodium: 138 mmol/L (ref 135–145)
TOTAL PROTEIN: 6.7 g/dL (ref 6.5–8.1)

## 2016-12-11 MED ORDER — ONDANSETRON 4 MG PO TBDP: 4.0000 mg | ORAL_TABLET | Freq: Three times a day (TID) | ORAL | 0 refills | Status: DC | PRN

## 2016-12-11 MED ORDER — TRAMADOL HCL 50 MG PO TABS: 50.0000 mg | ORAL_TABLET | Freq: Three times a day (TID) | ORAL | 0 refills | Status: DC | PRN

## 2016-12-11 NOTE — Progress Notes (Signed)
Patient alert and oriented, Post op day 1.  Provided support and encouragement.  Encouraged pulmonary toilet, ambulation and small sips of liquids. Patient has completed 10 ounces of clear fluid All questions answered.  Will continue to monitor.

## 2016-12-11 NOTE — Progress Notes (Signed)
Patient alert and oriented, pain is controlled. Patient is tolerating fluids, advanced to protein shake today, patient is tolerating well.  Reviewed Gastric sleeve discharge instructions with patient and patient is able to articulate understanding.  Provided information on BELT program, Support Group and [facility] outpatient pharmacy. All questions answered, will continue to monitor.  

## 2016-12-11 NOTE — Progress Notes (Signed)
   Progress Note: Metabolic and Bariatric Surgery Service   Chief Complaint/Subjective: Tolerating water, slightly queasy yesterday. Ambulating well, pain well controlled, complains of back pain  Objective: Vital signs in last 24 hours: Temp:  [97.4 F (36.3 C)-98.7 F (37.1 C)] 98.3 F (36.8 C) (08/01 0528) Pulse Rate:  [69-91] 76 (08/01 0528) Resp:  [11-18] 18 (08/01 0528) BP: (118-142)/(61-77) 118/65 (08/01 0528) SpO2:  [97 %-100 %] 100 % (08/01 0528) Weight:  [101.3 kg (223 lb 6 oz)] 101.3 kg (223 lb 6 oz) (07/31 1029) Last BM Date: 12/09/16  Intake/Output from previous day: 07/31 0701 - 08/01 0700 In: 2162.9 [P.O.:240; I.V.:1922.9] Out: 1925 [Urine:1900; Blood:25] Intake/Output this shift: Total I/O In: 60 [P.O.:60] Out: -   Lungs: CTAB  Cardiovascular: RRR  Abd: soft, NT, Nd, incisions c/d/i  Extremities: no edema  Neuro: AOx4  Lab Results: CBC   Recent Labs  12/10/16 1652 12/11/16 0547  WBC 11.6* 10.2  HGB 14.2 13.5  HCT 39.4 39.3  PLT 183 227   BMET  Recent Labs  12/10/16 1652 12/11/16 0547  NA  --  138  K  --  4.0  CL  --  103  CO2  --  26  GLUCOSE  --  113*  BUN  --  10  CREATININE 0.72 0.72  CALCIUM  --  8.9   PT/INR No results for input(s): LABPROT, INR in the last 72 hours. ABG No results for input(s): PHART, HCO3 in the last 72 hours.  Invalid input(s): PCO2, PO2  Studies/Results:  Anti-infectives: Anti-infectives    Start     Dose/Rate Route Frequency Ordered Stop   12/10/16 1030  cefoTEtan in Dextrose 5% (CEFOTAN) IVPB 2 g     2 g Intravenous On call to O.R. 12/10/16 1018 12/10/16 1243      Medications: Scheduled Meds: . enoxaparin (LOVENOX) injection  30 mg Subcutaneous Q12H  . pantoprazole (PROTONIX) IV  40 mg Intravenous QHS  . protein supplement shake  2 oz Oral Q2H   Continuous Infusions: . sodium chloride 100 mL/hr at 12/11/16 0206  . lactated ringers Stopped (12/10/16 1629)   PRN Meds:.oxyCODONE **AND**  acetaminophen, acetaminophen, hydrALAZINE, morphine injection, ondansetron (ZOFRAN) IV, simethicone, traMADol  Assessment/Plan: Patient Active Problem List   Diagnosis Date Noted  . Morbid obesity (HCC) 12/10/2016   s/p Procedure(s): LAPAROSCOPIC GASTRIC SLEEVE RESECTION, UPPER ENDOSCOPY 12/10/2016 -continue post op diet advancement per protocol  Disposition:  LOS: 1 day  The patient will be in the hospital for normal postop protocol  Rodman PickleLuke Aaron Benetta Maclaren, MD [phone] [facility]

## 2016-12-11 NOTE — Progress Notes (Signed)
Discharge and medication instructions reviewed with patient. Questions answered and patient denies further questions. Prescription for Ultram given to patient. Daughter is coming to pick her up and drive her home. Lina SarBeth Hanadi Stanly, RN

## 2016-12-12 NOTE — Discharge Summary (Signed)
Physician Discharge Summary  Savannah Zamora [id number] DOB: 03/19/1961 DOA: 12/10/2016  PCP: Ardyth GalYbanez, Jane, MD  Admit date: 12/10/2016 Discharge date: 12/12/2016  Recommendations for Outpatient Follow-up:  1.  (include homehealth, outpatient follow-up instructions, specific recommendations for PCP to follow-up on, etc.)  Follow-up Information    Kahli Mayon, De BlanchLuke Aaron, MD. Go on 01/01/2017.   Specialty:  General Surgery Why:  at 0230 Contact information: [address] [place] [address] [phone]        Sibyl Mikula, De BlanchLuke Aaron, MD Follow up.   Specialty:  General Surgery Contact information: [address] [place] [address] [phone]          Discharge Diagnoses:  Active Problems:   Morbid obesity (HCC)   Surgical Procedure: Laparoscopic Sleeve Gastrectomy, upper endoscopy  Discharge Condition: Good Disposition: Home  Diet recommendation: Postoperative sleeve gastrectomy diet (liquids only)  Filed Weights   12/10/16 1029  Weight: 101.3 kg (223 lb 6 oz)     Hospital Course:  The patient was admitted after undergoing laparoscopic sleeve gastrectomy. POD 0 she ambulated well. POD 1 she was started on the water diet protocol and tolerated 400 ml in the first shift. Once meeting the water amount she was advanced to bariatric protein shakes which they tolerated and were discharged home POD 1.  Treatments: surgery: laparoscopic sleeve gastrectomy  Discharge Instructions  Discharge Instructions    Ambulate hourly while awake    Complete by:  As directed    Call MD for:  difficulty breathing, headache or visual disturbances    Complete by:  As directed    Call MD for:  persistant dizziness or light-headedness    Complete by:  As directed    Call MD for:  persistant nausea and vomiting    Complete by:  As directed    Call MD for:  redness, tenderness, or signs of infection (pain, swelling, redness, odor or green/yellow discharge  around incision site)    Complete by:  As directed    Call MD for:  severe uncontrolled pain    Complete by:  As directed    Call MD for:  temperature >101 F    Complete by:  As directed    Diet bariatric full liquid    Complete by:  As directed    Discharge wound care:    Complete by:  As directed    Remove Bandaids tomorrow, ok to shower tomorrow. Steristrips may fall off in 1-3 weeks.   Incentive spirometry    Complete by:  As directed    Perform hourly while awake     Allergies as of 12/11/2016      Reactions   Codeine Nausea Only      Medication List    TAKE these medications   CALTRATE 600+D3 SOFT 600-800 MG-UNIT Chew Generic drug:  Calcium Carb-Cholecalciferol Chew 2 tablets by mouth 2 (two) times daily. Takes at 2pm and 6pm.   LORazepam 0.5 MG tablet Commonly known as:  ATIVAN Take 0.5 mg by mouth at bedtime.   losartan-hydrochlorothiazide 50-12.5 MG tablet Commonly known as:  HYZAAR Take 1 tablet by mouth daily. Notes to patient:  Monitor Blood Sugar Frequently and keep a log for primary care physician, you may need to adjust medication dosage with rapid weight loss.     multivitamin with minerals Tabs tablet Take 1 tablet by mouth 2 (two) times daily.   omeprazole 20 MG capsule Commonly known as:  PRILOSEC Take 20  mg by mouth daily.   ondansetron 4 MG disintegrating tablet Commonly known as:  ZOFRAN ODT Take 1 tablet (4 mg total) by mouth every 8 (eight) hours as needed for nausea or vomiting.   rizatriptan 10 MG tablet Commonly known as:  MAXALT Take 10 mg by mouth as needed for migraine. May repeat in 2 hours if needed   traMADol 50 MG tablet Commonly known as:  ULTRAM Take 1 tablet (50 mg total) by mouth every 8 (eight) hours as needed.      Follow-up Information    Jnai Snellgrove, De BlanchLuke Aaron, MD. Go on 01/01/2017.   Specialty:  General Surgery Why:  at 0230 Contact information: [address] [place] [address] [phone]         Livia Tarr, De BlanchLuke Aaron, MD Follow up.   Specialty:  General Surgery Contact information: [address] [place] [address] [phone]            The results of significant diagnostics from this hospitalization (including imaging, microbiology, ancillary and laboratory) are listed below for reference.    Significant Diagnostic Studies: No results found.  Labs: Basic Metabolic Panel:  Recent Labs Lab 12/10/16 1652 12/11/16 0547  NA  --  138  K  --  4.0  CL  --  103  CO2  --  26  GLUCOSE  --  113*  BUN  --  10  CREATININE 0.72 0.72  CALCIUM  --  8.9   Liver Function Tests:  Recent Labs Lab 12/11/16 0547  AST 33  ALT 31  ALKPHOS 93  BILITOT 0.5  PROT 6.7  ALBUMIN 3.8    CBC:  Recent Labs Lab 12/10/16 1434 12/10/16 1652 12/11/16 0547  WBC  --  11.6* 10.2  NEUTROABS  --   --  8.4*  HGB 13.3 14.2 13.5  HCT 38.1 39.4 39.3  MCV  --  80.4 82.4  PLT  --  183 227    CBG: No results for input(s): GLUCAP in the last 168 hours.  Active Problems:   Morbid obesity (HCC)   Time coordinating discharge: <5815min

## 2016-12-19 ENCOUNTER — Telehealth (HOSPITAL_COMMUNITY): Payer: Self-pay

## 2016-12-19 NOTE — Telephone Encounter (Addendum)
Voicemail left for patient with contact information to discuss the below questions.  Await return return response.  Call returned at 1531 12/19/16 Made discharge phone call to patientl. Asking the following questions.    1. Do you have someone to care for you now that you are home?  independent 2. Are you having pain now that is not relieved by your pain medication?  Taking pain medication for back pain 3. Are you able to drink the recommended daily amount of fluids (48 ounces minimum/day) and protein (60-80 grams/day) as prescribed by the dietitian or nutritional counselor?  60 grams of protein, 64 ounces of fluid 4. Are you taking the vitamins and minerals as prescribed?  Taking vitamins but switching to opurity, celebrate makes nauseated 5. Do you have the "on call" number to contact your surgeon if you have a problem or question?  yes 6. Are your incisions free of redness, swelling or drainage? (If steri strips, address that these can fall off, shower as tolerated) looks ok 7. Have your bowels moved since your surgery?  If not, are you passing gas?  yes 8. Are you up and walking 3-4 times per day?  Started this week 9. Were you provided your discharge medications before your surgery or before you were discharged from the hospital and are you taking them without problem?  yes

## 2016-12-24 ENCOUNTER — Encounter: Payer: BC Managed Care – PPO | Attending: General Surgery | Admitting: Registered"

## 2016-12-24 DIAGNOSIS — Z6841 Body Mass Index (BMI) 40.0 and over, adult: Secondary | ICD-10-CM | POA: Diagnosis not present

## 2016-12-24 DIAGNOSIS — E669 Obesity, unspecified: Secondary | ICD-10-CM

## 2016-12-24 DIAGNOSIS — Z713 Dietary counseling and surveillance: Secondary | ICD-10-CM | POA: Insufficient documentation

## 2016-12-24 DIAGNOSIS — Z01818 Encounter for other preprocedural examination: Secondary | ICD-10-CM | POA: Insufficient documentation

## 2016-12-24 NOTE — Progress Notes (Signed)
Bariatric Class:  Appt start time: 1530 end time:  1630.  2 Week Post-Operative Nutrition Class  Patient was seen on 12/24/2016 for Post[facility]ative Nutrition [facility].    Pt states she is not taking blood pressure medication anymore. Pt states she has had some mood change and some abdominal and back pain.    Surgery date: 12/10/2016 Surgery type: Sleeve gastrectomy [facility] weight at [facility]: 238.2 lbs Weight today: 214.2 lbs Weight change: 24 lb loss  TANITA  BODY COMP RESULTS  12/24/2016   BMI (kg/m^2) 38.6   Fat Mass (lbs) 106.6   Fat Free Mass (lbs) 107.6   Total Body Water (lbs) 77.6   The following the learning objectives were met by the patient during this course:  Identifies Phase 3A (Soft, High Proteins) Dietary Goals and will begin from 2 weeks post-operatively to 2 months post-operatively  Identifies appropriate sources of fluids and proteins   States protein recommendations and appropriate sources post-operatively  Identifies the need for appropriate texture modifications, mastication, and bite sizes when consuming solids  Identifies appropriate multivitamin and calcium sources post-operatively  Describes the need for physical activity post-operatively and will follow MD recommendations  States when to call healthcare provider regarding medication questions or post-operative complications  Handouts given during class include:  Phase 3A: Soft, High Protein Diet Handout  Follow-Up Plan: Patient will follow-up at [facility] in 6 weeks for 2 month post-op nutrition visit for diet advancement per MD.

## 2017-02-03 ENCOUNTER — Encounter: Payer: BC Managed Care – PPO | Attending: General Surgery | Admitting: Skilled Nursing Facility1

## 2017-02-03 ENCOUNTER — Encounter: Payer: Self-pay | Admitting: Skilled Nursing Facility1

## 2017-02-03 DIAGNOSIS — Z6841 Body Mass Index (BMI) 40.0 and over, adult: Secondary | ICD-10-CM | POA: Diagnosis not present

## 2017-02-03 DIAGNOSIS — Z01818 Encounter for other preprocedural examination: Secondary | ICD-10-CM | POA: Diagnosis present

## 2017-02-03 DIAGNOSIS — Z713 Dietary counseling and surveillance: Secondary | ICD-10-CM | POA: Diagnosis not present

## 2017-02-03 NOTE — Patient Instructions (Addendum)
-  Try a youtube video   -Eat all of your protein first then start in on your vegetables   -Be sure to chew well and not drink with meals

## 2017-02-03 NOTE — Progress Notes (Signed)
Follow-up visit:  8 Weeks Post-Operative Sleeve Surgery  Primary concerns today: Post-operative Bariatric Surgery Nutrition Management.  Pt states she has been sucking on her multi which is more tolerable. Pt states she is having one bowel movement a week. Pt states she just cannot get motivated to be physically active. Pt states she watches her one year old grand child throughout the week. Pt is struggling to follow the recommendations for example: eating past full.   Surgery date: 12/10/2016 Surgery type: Sleeve gastrectomy Start weight at [facility]: 238.2 lbs Weight today: 198.2 lbs   Weight change: 15.8 lb loss  TANITA  BODY COMP RESULTS  12/24/2016 02/03/2017   BMI (kg/m^2) 38.6 35.7   Fat Mass (lbs) 106.6 89.2   Fat Free Mass (lbs) 107.6 109   Total Body Water (lbs) 77.6 77.8    24-hr recall: B (AM): protein shake or egg and cheese Snk (AM):  L (PM): egg or protein shake or fast food chicken nuggets grilled Snk (PM):  D (PM): scallops and carrots meat or pinto beans Snk (PM): cottage cheese or yogurt  Fluid intake: water with flavoring (48-64) and protein shake, pumpkin spice coffee  Estimated total protein intake: 60+  Medications: See List Supplementation: Opurity multi and   Using straws: no Drinking while eating: no Having you been chewing well: yes Chewing/swallowing difficulties: no Changes in vision: no Changes to mood/headaches: no Hair loss/Cahnges to skin/Changes to nails: no Any difficulty focusing or concentrating: no Sweating: no Dizziness/Lightheaded:  Palpitations: no  Carbonated beverages: no N/V/D/C/GAS: vomited once from eating too fast, nausea from overeating on a couple occassions; constipated (taking a stool softner) Abdominal Pain: no Dumping syndrome: no  Recent physical activity:  ADL's  Progress Towards Goal(s):  In progress.  Handouts given during visit include:  Non-starchy veggies + protein    Intervention:  Nutrition counseling.  Dietitian educated the pt on advancing her diet to include non-starchy vegetables.  Goals: -Try a youtube video  -Eat all of your protein first then start in on your vegetables  -Be sure to chew well and not drink with meals  Teaching Method Utilized:  Visual Auditory Hands on  Barriers to learning/adherence to lifestyle change: unknown barrier   Demonstrated degree of understanding via:  Teach Back   Monitoring/Evaluation:  Dietary intake, exercise, and body weight.

## 2017-04-02 ENCOUNTER — Ambulatory Visit: Payer: BC Managed Care – PPO | Admitting: Skilled Nursing Facility1

## 2017-04-16 ENCOUNTER — Encounter: Payer: BC Managed Care – PPO | Attending: General Surgery | Admitting: Skilled Nursing Facility1

## 2017-04-16 ENCOUNTER — Encounter: Payer: Self-pay | Admitting: Skilled Nursing Facility1

## 2017-04-16 DIAGNOSIS — Z713 Dietary counseling and surveillance: Secondary | ICD-10-CM | POA: Diagnosis not present

## 2017-04-16 DIAGNOSIS — Z6841 Body Mass Index (BMI) 40.0 and over, adult: Secondary | ICD-10-CM | POA: Diagnosis not present

## 2017-04-16 DIAGNOSIS — Z01818 Encounter for other preprocedural examination: Secondary | ICD-10-CM | POA: Insufficient documentation

## 2017-04-16 NOTE — Patient Instructions (Addendum)
-  Have vegetables with every lunch and every dinner every day  -Aim for 64 fluid ounces day  -Look up some new recipes  -Look into Kefir

## 2017-04-16 NOTE — Progress Notes (Signed)
Follow-up visit:  8 Weeks Post-Operative Sleeve Surgery  Primary concerns today: Post-operative Bariatric Surgery Nutrition Management.  Pt arrives with her fussy grandchild. Pt states she has been struggling to find a multivitamin she can tolerate. Pt states she cannot tolerate yogurt currently. Pt states she has had toe surgery. Pt states she was gone from a 3X to a large size. Pt states she is afraid she is not eating enough protein.   Surgery date: 12/10/2016 Surgery type: Sleeve gastrectomy [facility] Weight today: 184.8 lbs   Weight change: 13.2 lb loss  TANITA  BODY COMP RESULTS  12/24/2016 02/03/2017 04/16/2017   BMI (kg/m^2) 38.6 35.7 33.3   Fat Mass (lbs) 106.6 89.2 78.2   Fat Free Mass (lbs) 107.6 109 106.6   Total Body Water (lbs) 77.6 77.8 75.6   24-hr recall: grandchild to house at 6:45am B (AM): 1/2c cup cottage cheese Snk (AM): 1 scrambled eggs and cheese (12g) L (PM): grilled chicken and green bean  Snk (PM): cheese D (PM):chicken  Snk (PM): cottage cheese or yogurt  Fluid intake: water with flavoring (48 oz) and protein shake, coffee  Estimated total protein intake: 60+  Medications: See List Supplementation: procare capsule multi and calcium   Using straws: no Drinking while eating: no Having you been chewing well: yes Chewing/swallowing difficulties: no Changes in vision: no Changes to mood/headaches: no Hair loss/Cahnges to skin/Changes to nails: more hair loss Any difficulty focusing or concentrating: no Sweating: no Dizziness/Lightheaded:  Palpitations: no  Carbonated beverages: no N/V/D/C/GAS:  constipated (taking a stool softner) Abdominal Pain: no Dumping syndrome: no  Recent physical activity:  ADL's  Progress Towards Goal(s):  In progress.  Handouts given during visit include:  Non-starchy veggies + protein    Intervention:  Nutrition counseling. Dietitian educated the pt on advancing her diet to include  non-starchy vegetables.  Goals: -Have vegetables with every lunch and every dinner every day -Aim for 64 fluid ounces day -Look up some new recipes -Look into Kefir Teaching Method Utilized:  Visual Auditory Hands on  Barriers to learning/adherence to lifestyle change: unknown barrier   Demonstrated degree of understanding via:  Teach Back   Monitoring/Evaluation:  Dietary intake, exercise, and body weight.

## 2017-06-02 ENCOUNTER — Ambulatory Visit (INDEPENDENT_AMBULATORY_CARE_PROVIDER_SITE_OTHER): Payer: BC Managed Care – PPO | Admitting: Orthopaedic Surgery

## 2017-06-02 ENCOUNTER — Ambulatory Visit (INDEPENDENT_AMBULATORY_CARE_PROVIDER_SITE_OTHER): Payer: Self-pay

## 2017-06-02 ENCOUNTER — Encounter (INDEPENDENT_AMBULATORY_CARE_PROVIDER_SITE_OTHER): Payer: Self-pay | Admitting: Orthopaedic Surgery

## 2017-06-02 VITALS — BP 145/85 | HR 68 | Resp 14 | Ht 62.5 in | Wt 180.0 lb

## 2017-06-02 DIAGNOSIS — M25511 Pain in right shoulder: Secondary | ICD-10-CM

## 2017-06-02 NOTE — Progress Notes (Signed)
Office Visit Note   Patient: Savannah Zamora           Date of Birth: [birth year]           MRN: [id number] Visit Date: 06/02/2017              Requested by: Savannah GalYbanez, Jane, MD No address on file PCP: Savannah GalYbanez, Jane, MD   Assessment & Plan: Visit Diagnoses:  1. Acute pain of right shoulder     Plan: Probable subacromial bursitis right shoulder. Overall 2 days ago with significant pain. Was seen at an urgent care facility and placed on diclofenac. Feeling a little bit better. Had arthroscopy in the past with resolution of her preoperative symptoms. Would continue with the diclofenac heat and return to the office and a week if still having a problem to consider subacromial cortisone injection  Follow-Up Instructions: Return in about 1 week (around 06/09/2017).   Orders:  Orders Placed This Encounter  Procedures  . XR Shoulder Right   No orders of the defined types were placed in this encounter.     Procedures: No procedures performed   Clinical Data: No additional findings.   Subjective: Chief Complaint  Patient presents with  . Right Shoulder - Pain    [name] is a 57 y o here today for Right shoulder pain x 2 days. Hx of Right arthroscopic 20 + yrs ago. Pt was putting up a bulletin board on Friday and woke with pain Saturfday. Pt went to UC on Sat. No XR. Hx of bariatric bypass 6 mo ago  No related numbness or tingling. Feeling a little better after started taking the diclofenac. No specific injury. Pain occurred within 24 hours of constructing a bulletin board  HPI  Review of Systems  Constitutional: Negative for chills, fatigue and fever.  Eyes: Negative for itching.  Respiratory: Negative for chest tightness and shortness of breath.   Cardiovascular: Negative for chest pain, palpitations and leg swelling.  Gastrointestinal: Negative for blood in stool, constipation and diarrhea.  Endocrine: Negative for polyuria.  Genitourinary: Negative for dysuria.    Musculoskeletal: Negative for back pain, joint swelling, neck pain and neck stiffness.  Skin: Positive for rash.  Allergic/Immunologic: Negative for immunocompromised state.  Neurological: Negative for dizziness and numbness.  Hematological: Does not bruise/bleed easily.  Psychiatric/Behavioral: The patient is not nervous/anxious.      Objective: Vital Signs: BP (!) 145/85   Pulse 68   Resp 14   Ht 5' 2.5" (1.588 m)   Wt 180 lb (81.6 kg)   BMI 32.40 kg/m   Physical Exam  Ortho Exam awake alert and oriented 3.. Comfortable sitting. Negative empty can testing right shoulder. Minimally positive impingement. No grating or crepitation. Able to place right arm fully overhead with a circuitous motion. Some tenderness over the anterior lateral subacromial region. No pain at the acromioclavicular joint. Skin intact. Neurovascular exam intact  Specialty Comments:  No specialty comments available.  Imaging: Xr Shoulder Right  Result Date: 06/02/2017 Films of the right shoulder obtained in several projections. There might be slight decrease in the space between the humeral head and the acromium. The humeral head is centered about the glenoid. No ectopic calcification. No acute injury identified    PMFS History: Patient Active Problem List   Diagnosis Date Noted  . Morbid obesity (HCC) 12/10/2016   Past Medical History:  Diagnosis Date  . Arthritis   . GERD (gastroesophageal reflux disease)   . Hypertension   .  Migraines   . PONV (postoperative nausea and vomiting)   . Sleep apnea     Family History  Problem Relation Age of Onset  . Cancer Other   . Hypertension Other   . Diabetes Other     Past Surgical History:  Procedure Laterality Date  . ABDOMINAL HYSTERECTOMY    . carpal tunnel release  Right   . CESAREAN SECTION     x2   . CHOLECYSTECTOMY    . LAPAROSCOPIC GASTRIC SLEEVE RESECTION N/A 12/10/2016   Procedure: LAPAROSCOPIC GASTRIC SLEEVE RESECTION, UPPER  ENDOSCOPY;  Surgeon: Sheliah Hatch, De Blanch, MD;  Location: [facility];  Service: General;  Laterality: N/A;  . NASAL POLYP SURGERY  2013  . SHOULDER ARTHROSCOPY Right    Social History   Occupational History  . Not on file  Tobacco Use  . Smoking status: Never Smoker  . Smokeless tobacco: Never Used  Substance and Sexual Activity  . Alcohol use: No  . Drug use: No  . Sexual activity: Not on file

## 2017-06-06 ENCOUNTER — Ambulatory Visit (INDEPENDENT_AMBULATORY_CARE_PROVIDER_SITE_OTHER): Payer: BC Managed Care – PPO | Admitting: Orthopaedic Surgery

## 2018-09-12 IMAGING — CR DG CHEST 2V
2 series · 2 of 2 positions shown · non-contrast
Comparison: No recent prior.

CLINICAL DATA: Morbid obesity.

EXAM:
CHEST  2 VIEW

[w chest pa]
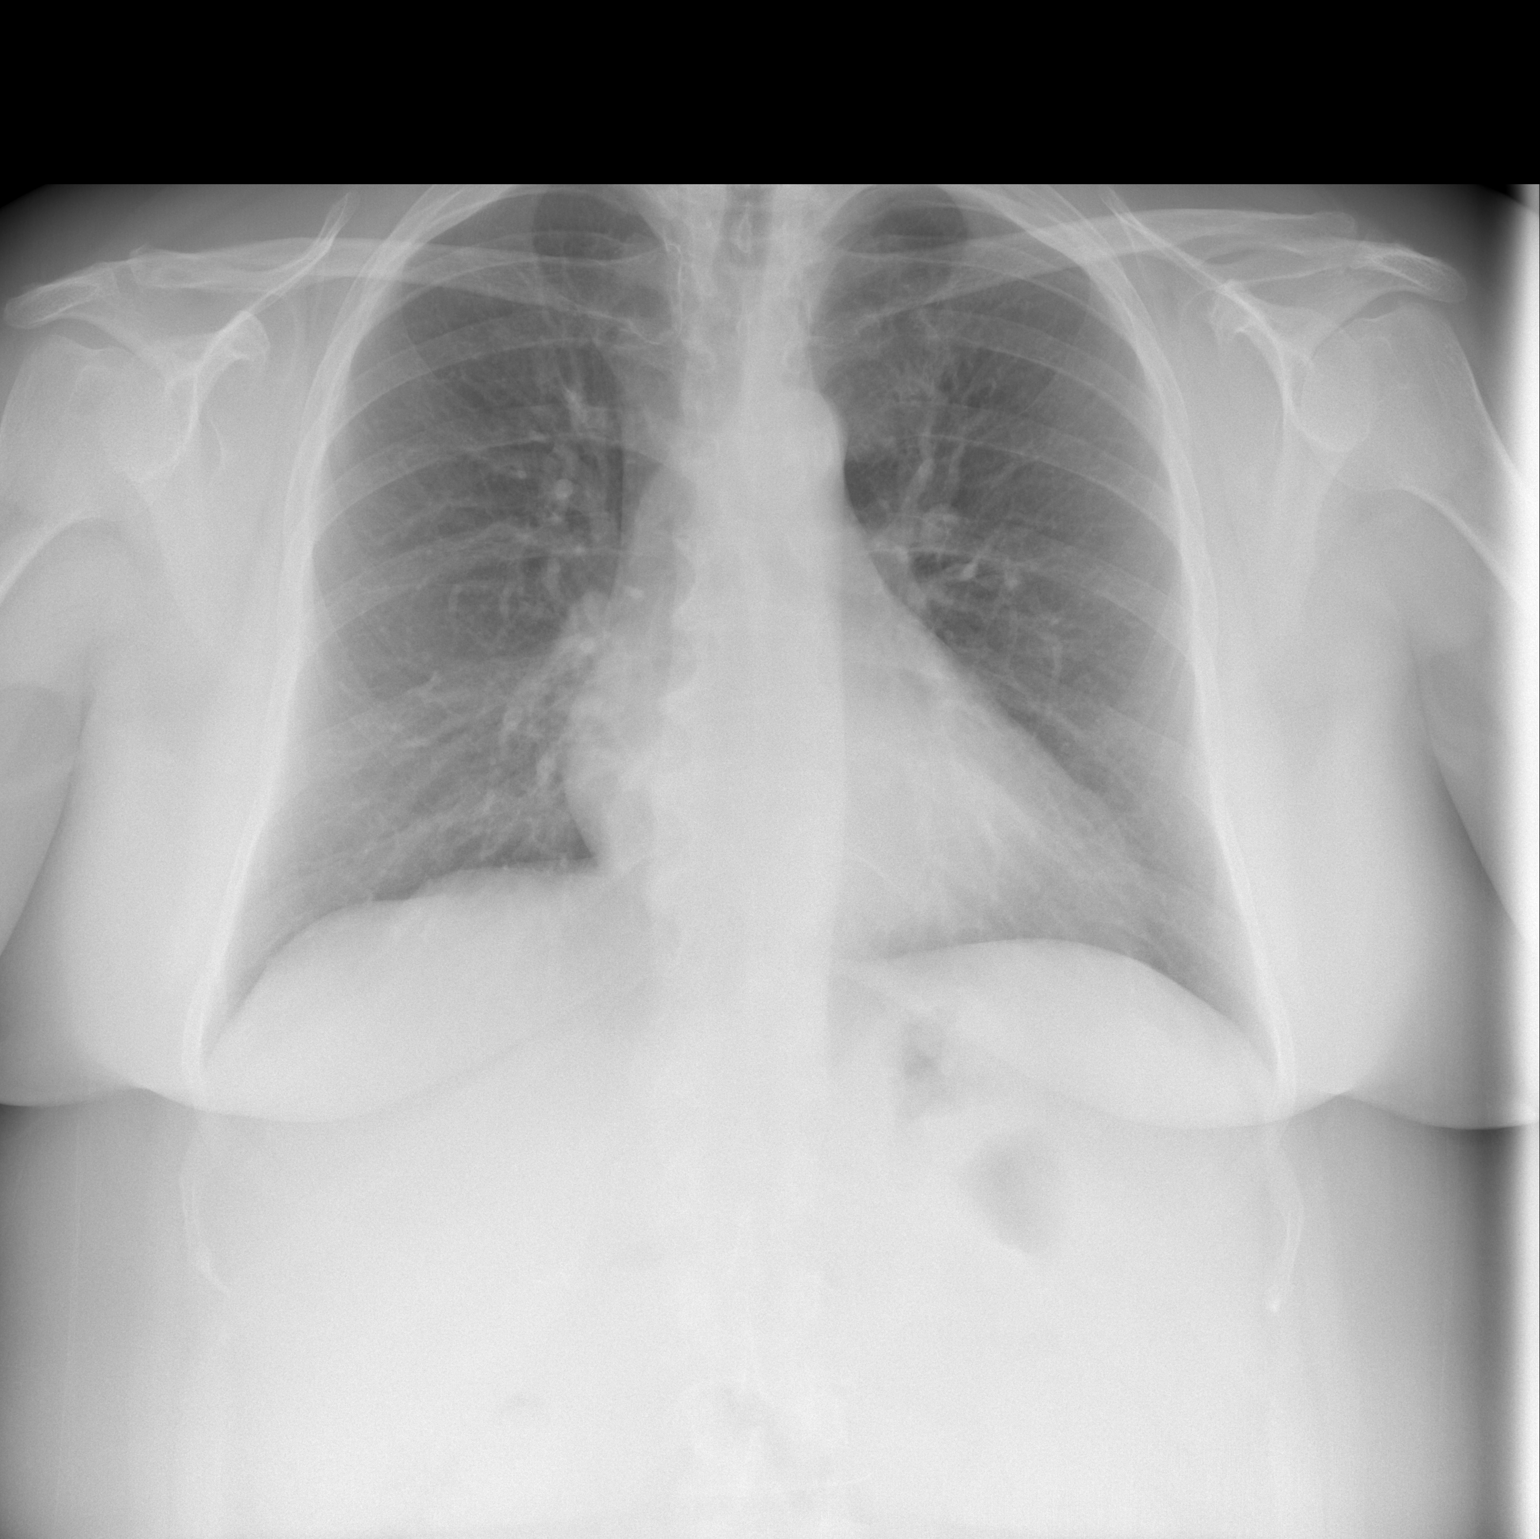

[w chest lat]
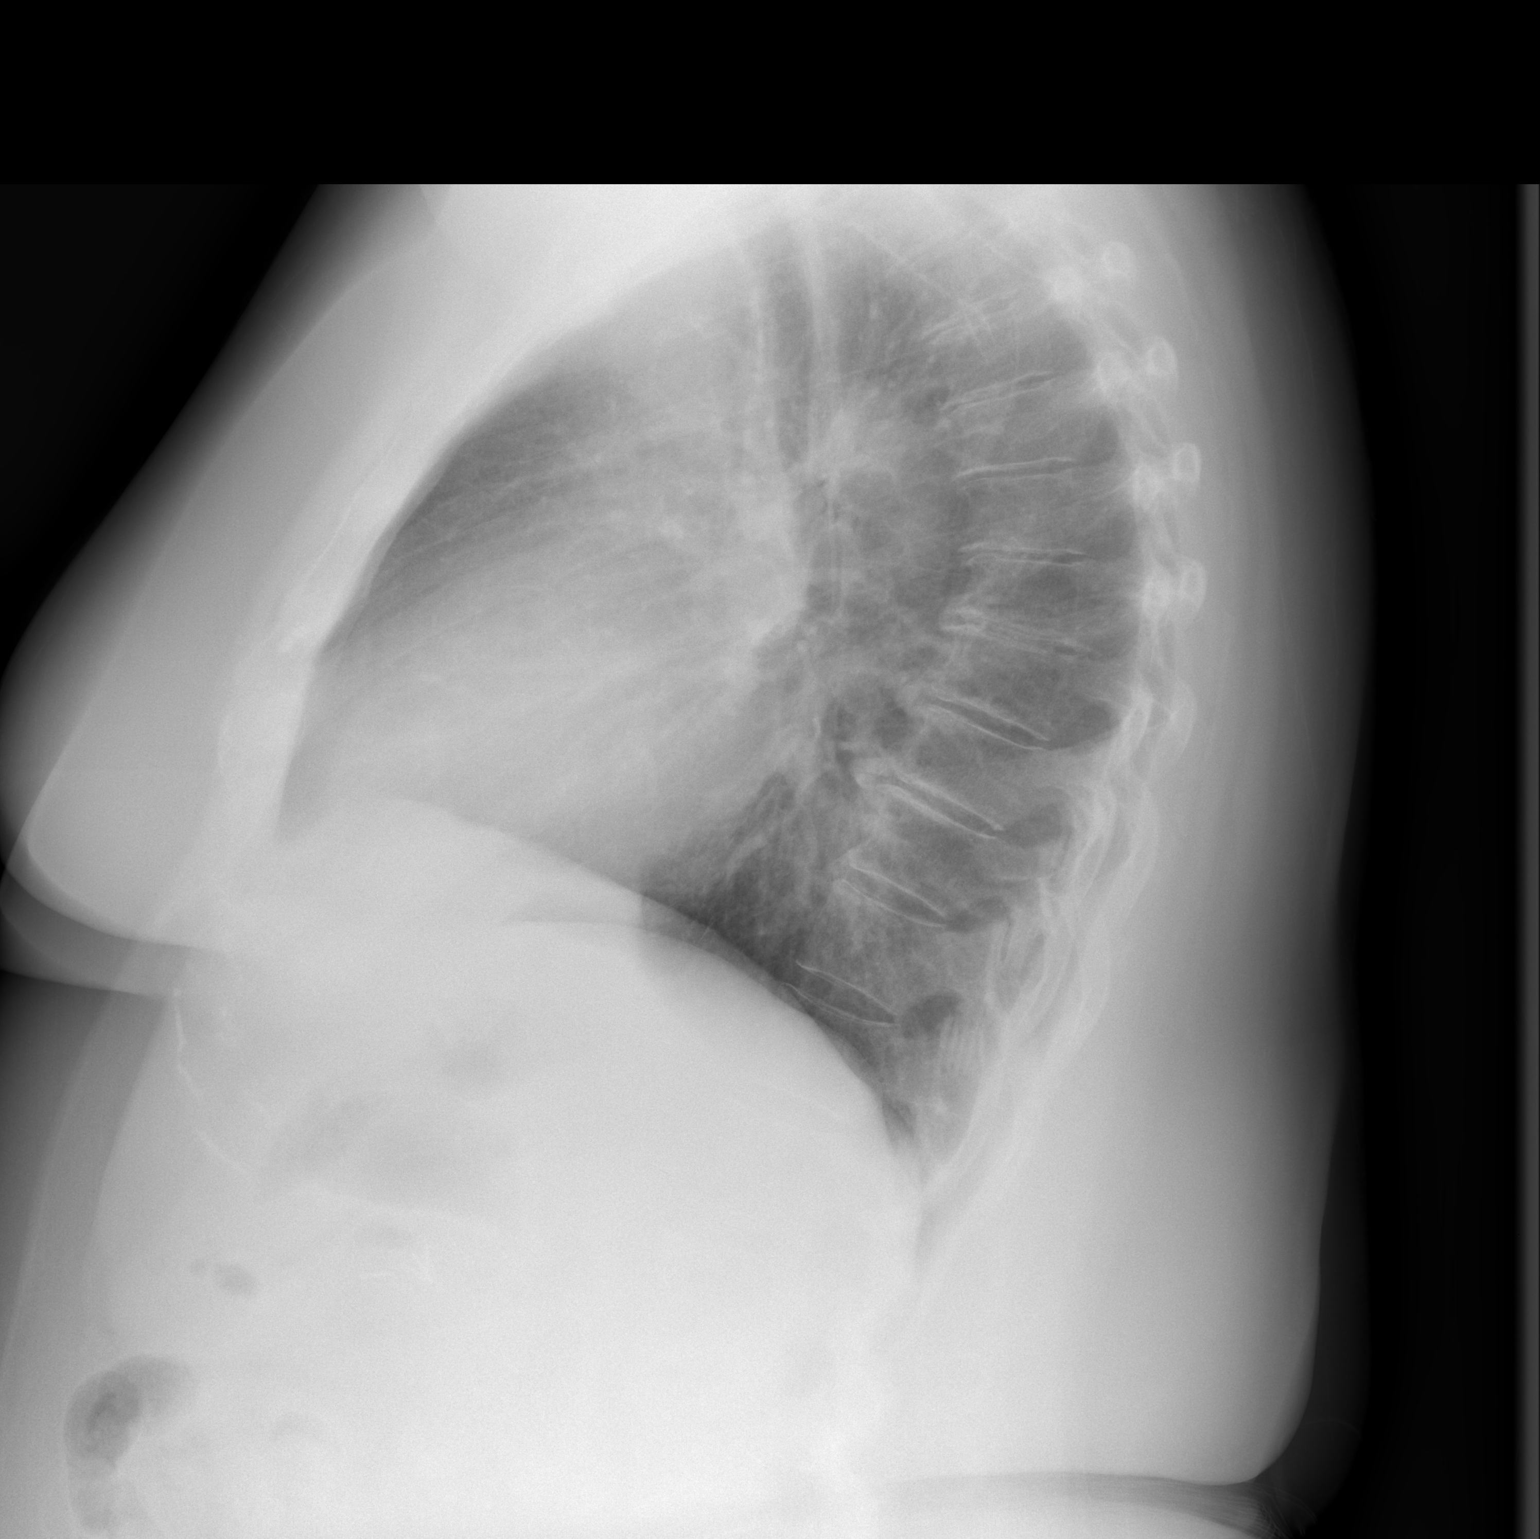

[2 of 2 positions shown; findings below may reference images not displayed]

FINDINGS: Mediastinum and hilar structures normal the lungs are clear. No
pleural effusion or pneumothorax. Heart size normal. Degenerative
changes thoracic spine. Surgical clips right upper quadrant.
IMPRESSION: No acute cardiopulmonary disease.

## 2019-01-14 ENCOUNTER — Ambulatory Visit: Payer: BC Managed Care – PPO | Admitting: Orthopaedic Surgery

## 2019-01-19 ENCOUNTER — Encounter: Payer: Self-pay | Admitting: Orthopaedic Surgery

## 2019-01-19 ENCOUNTER — Other Ambulatory Visit: Payer: Self-pay

## 2019-01-19 ENCOUNTER — Ambulatory Visit: Payer: BC Managed Care – PPO | Admitting: Orthopaedic Surgery

## 2019-01-19 DIAGNOSIS — M7712 Lateral epicondylitis, left elbow: Secondary | ICD-10-CM | POA: Diagnosis not present

## 2019-01-19 DIAGNOSIS — M25511 Pain in right shoulder: Secondary | ICD-10-CM | POA: Insufficient documentation

## 2019-01-19 DIAGNOSIS — G8929 Other chronic pain: Secondary | ICD-10-CM

## 2019-01-19 MED ORDER — BUPIVACAINE HCL 0.5 % IJ SOLN
2.0000 mL | INTRAMUSCULAR | Status: AC | PRN
  Administered 2019-01-19: 16:00:00 2 mL via INTRA_ARTICULAR

## 2019-01-19 MED ORDER — METHYLPREDNISOLONE ACETATE 40 MG/ML IJ SUSP
80.0000 mg | INTRAMUSCULAR | Status: AC | PRN
  Administered 2019-01-19: 16:00:00 80 mg via INTRA_ARTICULAR

## 2019-01-19 MED ORDER — LIDOCAINE HCL 2 % IJ SOLN
2.0000 mL | INTRAMUSCULAR | Status: AC | PRN
  Administered 2019-01-19: 16:00:00 2 mL

## 2019-01-19 NOTE — Progress Notes (Signed)
Office Visit Note   Patient: Savannah Zamora           Date of Birth: October 28, 1960           MRN: [id number] Visit Date: 01/19/2019              Requested by: Concepcion Elk, MD [address] [place],  [place] [address] PCP: Concepcion Elk, MD   Assessment & Plan: Visit Diagnoses:  1. Chronic right shoulder pain   2. Left tennis elbow     Plan: Impingement syndrome right shoulder.  Will inject subacromial space with cortisone and monitor response.  Left arm pain appears to be related to tennis elbow.  Will have her use a tennis elbow splint and Voltaren gel.  Office 3 to 4 weeks if no improvement  Follow-Up Instructions: Return if symptoms worsen or fail to improve.   Orders:  Orders Placed This Encounter  Procedures  . Large Joint Inj: R subacromial bursa   No orders of the defined types were placed in this encounter.     Procedures: Large Joint Inj: R subacromial bursa on 01/19/2019 4:21 PM Indications: pain and diagnostic evaluation Details: 25 G 1.5 in needle, anterolateral approach  Arthrogram: No  Medications: 2 mL lidocaine 2 %; 2 mL bupivacaine 0.5 %; 80 mg methylPREDNISolone acetate 40 MG/ML Consent was given by the patient. Immediately prior to procedure a time out was called to verify the correct patient, procedure, equipment, support staff and site/side marked as required. Patient was prepped and draped in the usual sterile fashion.       Clinical Data: No additional findings.   Subjective: Chief Complaint  Patient presents with  . Right Shoulder - Pain  Patient presents today for recurrent right shoulder pain. She was last here in January of 2019. She is also having pain in her left arm that has been present for several months. She has two grandchildren that she keeps during the day and has to lift them a lot, which causes pain. Her left arm is weaker. Her pain is worse in the morning and describes pain all throughout her arm, but not in her neck.  No  numbness or tingling or neck pain.  Has had prior arthroscopic debridement on the right shoulder and did well.  HPI  Review of Systems   Objective: Vital Signs: BP (!) 143/87   Pulse 73   Ht 5' 2.5" (1.588 m)   Wt 199 lb (90.3 kg)   BMI 35.82 kg/m   Physical Exam Constitutional:      Appearance: She is well-developed.  Eyes:     Pupils: Pupils are equal, round, and reactive to light.  Pulmonary:     Effort: Pulmonary effort is normal.  Skin:    General: Skin is warm and dry.  Neurological:     Mental Status: She is alert and oriented to person, place, and time.  Psychiatric:        Behavior: Behavior normal.     Ortho Exam awake alert and oriented x3.  Comfortable sitting mild impingement testing right shoulder.  Good strength.  Speeds sign negative.  No AC joint pain.  No grating or crepitation.  Full overhead motion with minimal discomfort.  Some pain along the arm to come down from the overhead position.  Good grip and release.  No pain with range of motion of cervical spine.  Left elbow with some tenderness over the lateral epicondyles.  This is associated with some pain  with grip and extension.  No shoulder pain.  Neurologically intact both upper extremities Specialty Comments:  No specialty comments available.  Imaging: No results found.   PMFS History: Patient Active Problem List   Diagnosis Date Noted  . Pain in right shoulder 01/19/2019  . Left tennis elbow 01/19/2019  . Morbid obesity (HCC) 12/10/2016   Past Medical History:  Diagnosis Date  . Arthritis   . GERD (gastroesophageal reflux disease)   . Hypertension   . Migraines   . PONV (postoperative nausea and vomiting)   . Sleep apnea     Family History  Problem Relation Age of Onset  . Cancer Other   . Hypertension Other   . Diabetes Other     Past Surgical History:  Procedure Laterality Date  . ABDOMINAL HYSTERECTOMY    . carpal tunnel release  Right   . CESAREAN SECTION     x2   .  CHOLECYSTECTOMY    . LAPAROSCOPIC GASTRIC SLEEVE RESECTION N/A 12/10/2016   Procedure: LAPAROSCOPIC GASTRIC SLEEVE RESECTION, UPPER ENDOSCOPY;  Surgeon: Sheliah HatchKinsinger, De BlanchLuke Aaron, MD;  Location: [facility];  Service: General;  Laterality: N/A;  . NASAL POLYP SURGERY  2013  . SHOULDER ARTHROSCOPY Right    Social History   Occupational History  . Not on file  Tobacco Use  . Smoking status: Never Smoker  . Smokeless tobacco: Never Used  Substance and Sexual Activity  . Alcohol use: No  . Drug use: No  . Sexual activity: Not on file

## 2019-03-05 ENCOUNTER — Encounter (HOSPITAL_COMMUNITY): Payer: Self-pay

## 2019-06-30 ENCOUNTER — Encounter (HOSPITAL_COMMUNITY): Payer: Self-pay

## 2020-07-05 ENCOUNTER — Encounter (HOSPITAL_COMMUNITY): Payer: Self-pay | Admitting: *Deleted

## 2020-12-13 ENCOUNTER — Encounter: Payer: Self-pay | Admitting: Orthopaedic Surgery

## 2020-12-13 ENCOUNTER — Ambulatory Visit: Payer: Self-pay

## 2020-12-13 ENCOUNTER — Ambulatory Visit: Payer: Self-pay | Admitting: Orthopaedic Surgery

## 2020-12-13 ENCOUNTER — Other Ambulatory Visit: Payer: Self-pay

## 2020-12-13 VITALS — Ht 62.5 in | Wt 215.0 lb

## 2020-12-13 DIAGNOSIS — M79604 Pain in right leg: Secondary | ICD-10-CM | POA: Diagnosis not present

## 2020-12-13 DIAGNOSIS — M25561 Pain in right knee: Secondary | ICD-10-CM | POA: Diagnosis not present

## 2020-12-13 DIAGNOSIS — M5441 Lumbago with sciatica, right side: Secondary | ICD-10-CM

## 2020-12-13 DIAGNOSIS — M25551 Pain in right hip: Secondary | ICD-10-CM

## 2020-12-13 DIAGNOSIS — M545 Low back pain, unspecified: Secondary | ICD-10-CM | POA: Diagnosis not present

## 2020-12-13 DIAGNOSIS — G8929 Other chronic pain: Secondary | ICD-10-CM

## 2020-12-13 NOTE — Progress Notes (Signed)
Office Visit Note   Patient: Savannah Zamora           Date of Birth: 08-02-1960           MRN: [id number] Visit Date: 12/13/2020              Requested by: Ardyth Gal, MD [address] [place],  [place] [address] PCP: Ardyth Gal, MD   Assessment & Plan: Visit Diagnoses:  1. Chronic pain of right knee   2. Pain in right leg   3. Chronic right-sided low back pain, unspecified whether sciatica present   4. Chronic right-sided low back pain with right-sided sciatica   5. Pain in right hip     Plan: Savannah Zamora has been experiencing chronic pain in her low back, right buttock and hip and right knee associated with possible radiculopathy for a long time.  She notes that she has had an exacerbation since the spring of this year but without any obvious injury or trauma.  She has not had any numbness or tingling but oftentimes feels like her leg is weak.  She has experienced some thigh and groin pain and particularly at night.  She has had low back pain on a chronic basis and has difficulty sleeping on her right side because of her back pain.  She has had trouble with her right knee for well over a year and oftentimes will pop and click with some pain along the anterior aspect of her knee.  She has not had any giving way or swelling.  She does take ibuprofen when necessary.  She has not had any prior hip or knee or back surgery.  In the past she has had history of back problems and 20 years ago was hospitalized for her back and went through a course of traction and PT.  In the in the interim and over the years she has tried different creams and ointments and even CBD oil.  Films of her lumbar spine demonstrate considerable degenerative change at L4-5 and L5-S1.  I suspect that may be the cause of a lot of her pain we will order an MRI scan.  Films of her pelvis were negative for any obvious hip pathology.  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  We discussed exercises and continued use of over-the-counter medicine for her knees.  We will hold on specific treatment for her back pending the results of the MRI scan  Follow-Up Instructions: Return After open MRI scan lumbar spine.   Orders:  Orders Placed This Encounter  Procedures   XR Lumbar Spine 2-3 Views   XR Pelvis 1-2 Views   XR KNEE 3 VIEW RIGHT   MR Lumbar Spine w/o contrast   No orders of the defined types were placed in this encounter.     Procedures: No procedures performed   Clinical Data: No additional findings.   Subjective: Chief Complaint  Patient presents with   Right Hip - Pain  Patient presents today for right hip and right knee pain. She said that her right leg feels weak and unstable. She has pain in her groin and knee, but also states that she has some lower back pain. She aches at night and cannot lay on her right side. Her right knee pops. No swelling or giving way. No previous surgery to either area. She takes Ibuprofen as needed.  HPI  Review of Systems   Objective: Vital  Signs: Ht 5' 2.5" (1.588 m)   Wt 215 lb (97.5 kg)   BMI 38.70 kg/m   Physical Exam Constitutional:      Appearance: She is well-developed.  Pulmonary:     Effort: Pulmonary effort is normal.  Skin:    General: Skin is warm and dry.  Neurological:     Mental Status: She is alert and oriented to person, place, and time.  Psychiatric:        Behavior: Behavior normal.    Ortho Exam awake alert and oriented x3.  Comfortable sitting.  Straight leg raise negative.  Had minimal discomfort on the extreme of external rotation of both of her hips but her x-rays were relatively clear.  No effusion of either knee but does have considerable crepitation with flexion extension.  No apprehension with patella motion and no history of prior patella problems.  No medial lateral joint pain or effusion.  Full extension of flexed about 100 degrees without  instability.  Motor exam intact.  Some mild percussible tenderness of lower lumbar spine more to the right than the left.  No masses.  Specialty Comments:  No specialty comments available.  Imaging: XR KNEE 3 VIEW RIGHT  Result Date: 12/13/2020 Films of the right knee were obtained in 3 projections standing.  There are some very minimal degenerative changes at the patellofemoral joint with small peripheral osteophytes and may be slight narrowing of the patellofemoral articulation.  Very minimal osteophyte along the lateral femoral condyle but alignment looks normal and anatomic and no ectopic calcification.  Medial lateral joint surfaces appear to be fairly symmetrical.  Films are consistent with some mild osteoarthritis.  Most of the symptoms are referable to the patellofemoral joint consistent with chondromalacia patella  XR Lumbar Spine 2-3 Views  Result Date: 12/13/2020 Films of the lumbar spine were obtained in 2 projections.  There is very minimal degenerative scoliosis of the lumbar spine with a long C curve.  There is anterior listhesis of grade 1 on L4-5 and decreased disc space height at L4-5 and slightly at L5-S1.  Facet sclerosis at both levels.  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  Joint spaces are well-maintained.  There was some mild sclerosis of the right SI joint.  Abundant bowel gas obliterates some of the detail    PMFS History: Patient Active Problem List   Diagnosis Date Noted   Low back pain 12/13/2020   Pain in right hip 12/13/2020   Pain in right knee 12/13/2020   Pain in right shoulder 01/19/2019   Left tennis elbow 01/19/2019   Morbid obesity (HCC) 12/10/2016   Past Medical History:  Diagnosis Date   Arthritis    GERD (gastroesophageal reflux disease)    Hypertension    Migraines    PONV (postoperative  nausea and vomiting)    Sleep apnea     Family History  Problem Relation Age of Onset   Cancer Other    Hypertension Other    Diabetes Other     Past Surgical History:  Procedure Laterality Date   ABDOMINAL HYSTERECTOMY     carpal tunnel release  Right    CESAREAN SECTION     x2    CHOLECYSTECTOMY     LAPAROSCOPIC GASTRIC SLEEVE RESECTION N/A 12/10/2016   Procedure: LAPAROSCOPIC GASTRIC SLEEVE RESECTION, UPPER ENDOSCOPY;  Surgeon: Rodman Pickle,  MD;  Location: [facility];  Service: General;  Laterality: N/A;   NASAL POLYP SURGERY  2013   SHOULDER ARTHROSCOPY Right    Social History   Occupational History   Not on file  Tobacco Use   Smoking status: Never   Smokeless tobacco: Never  Substance and Sexual Activity   Alcohol use: No   Drug use: No   Sexual activity: Not on file

## 2021-01-10 ENCOUNTER — Ambulatory Visit: Payer: BC Managed Care – PPO | Admitting: Orthopaedic Surgery

## 2021-01-10 ENCOUNTER — Other Ambulatory Visit: Payer: Self-pay

## 2021-01-10 ENCOUNTER — Encounter: Payer: Self-pay | Admitting: Orthopaedic Surgery

## 2021-01-10 VITALS — Ht 62.5 in | Wt 215.0 lb

## 2021-01-10 DIAGNOSIS — M5441 Lumbago with sciatica, right side: Secondary | ICD-10-CM

## 2021-01-10 DIAGNOSIS — G8929 Other chronic pain: Secondary | ICD-10-CM

## 2021-01-10 NOTE — Progress Notes (Signed)
Office Visit Note   Patient: Savannah Zamora           Date of Birth: [birth year]           MRN: [id number] Visit Date: 01/10/2021              Requested by: Ardyth Gal, MD [address] [place],  [place] [address] PCP: Ardyth Gal, MD   Assessment & Plan: Visit Diagnoses:  1. Chronic right-sided low back pain with right-sided sciatica     Plan: Savannah Zamora had an MRI scan of her lumbar spine performed through the [facility].  I have a copy of the report which I reviewed with her.  Specifically at L4-5 there was moderate degenerative disc disease.  There was facet arthropathy with a slight degenerative spondylolisthesis.  No spinal stenosis or nerve root compression.  There was mild degenerative disc disease at L5-S1 but no significant facet disease.  There was no evidence of spinal or foraminal stenosis at any level.  She has been experiencing back pain and to some extent referred discomfort into her right lower extremity.  I would suggest a course of physical therapy for her spine in [place] where she lives.  I also would like Dr. Alvester Morin to evaluate her for consideration of possible facet injections at L4-5.  She was fine with the above suggestions.  We will plan to see her back in some interval after she has had an opportunity to see how the exercises the injections work.  Follow-Up Instructions: Return if symptoms worsen or fail to improve.   Orders:  Orders Placed This Encounter  Procedures   Ambulatory referral to Physical Therapy   Ambulatory referral to Physical Medicine Rehab   No orders of the defined types were placed in this encounter.     Procedures: No procedures performed   Clinical Data: No additional findings.   Subjective: Chief Complaint  Patient presents with   Lower Back - Follow-up    MRI review  Patient presents today for follow up on her lower back. She had an MRI and is here today to go over those results.  No change in symptoms.   Still experiencing low back pain with some discomfort referred along the lateral aspect of her right hip and thigh  HPI  Review of Systems   Objective: Vital Signs: Ht 5' 2.5" (1.588 m)   Wt 215 lb (97.5 kg)   BMI 38.70 kg/m   Physical Exam Constitutional:      Appearance: She is well-developed.  Pulmonary:     Effort: Pulmonary effort is normal.  Skin:    General: Skin is warm and dry.  Neurological:     Mental Status: She is alert and oriented to person, place, and time.  Psychiatric:        Behavior: Behavior normal.    Ortho Exam straight leg raise negative.  Painless range of motion both hips.  No particular tenderness over the lateral aspect of either hip.  There are some areas of trigger point tenderness along the lower lumbar spine.  No pain over either SI joint.  Walks without a limp or use of any ambulatory aid  Specialty Comments:  No specialty comments available.  Imaging: No results found.   PMFS History: Patient Active Problem List   Diagnosis Date Noted   Low back pain 12/13/2020   Pain in right hip 12/13/2020   Pain in right knee 12/13/2020   Pain in right shoulder  01/19/2019   Left tennis elbow 01/19/2019   Morbid obesity (HCC) 12/10/2016   Past Medical History:  Diagnosis Date   Arthritis    GERD (gastroesophageal reflux disease)    Hypertension    Migraines    PONV (postoperative nausea and vomiting)    Sleep apnea     Family History  Problem Relation Age of Onset   Cancer Other    Hypertension Other    Diabetes Other     Past Surgical History:  Procedure Laterality Date   ABDOMINAL HYSTERECTOMY     carpal tunnel release  Right    CESAREAN SECTION     x2    CHOLECYSTECTOMY     LAPAROSCOPIC GASTRIC SLEEVE RESECTION N/A 12/10/2016   Procedure: LAPAROSCOPIC GASTRIC SLEEVE RESECTION, UPPER ENDOSCOPY;  Surgeon: Sheliah Hatch, De Blanch, MD;  Location: [facility];  Service: General;  Laterality: N/A;   NASAL POLYP SURGERY  2013   SHOULDER  ARTHROSCOPY Right    Social History   Occupational History   Not on file  Tobacco Use   Smoking status: Never   Smokeless tobacco: Never  Substance and Sexual Activity   Alcohol use: No   Drug use: No   Sexual activity: Not on file

## 2021-02-07 ENCOUNTER — Telehealth: Payer: Self-pay

## 2021-02-07 NOTE — Telephone Encounter (Signed)
PT for L-S DJD-eval and treat

## 2021-02-07 NOTE — Telephone Encounter (Signed)
Patient called and wants a referral to PT instead of having an injection. Please advise. Thanks!

## 2021-02-07 NOTE — Telephone Encounter (Signed)
Called and spoke with patient. PT was ordered at the end of August. Patient has not heard from them and has been unsuccessful in calling them. I offered to send the referral somewhere else and she declined. She will continue to try and reach them. Also sent Martie Lee a message to double check that the referral did in fact go through.

## 2021-07-06 ENCOUNTER — Encounter (HOSPITAL_COMMUNITY): Payer: Self-pay | Admitting: *Deleted

## 2021-12-10 ENCOUNTER — Other Ambulatory Visit: Payer: Self-pay | Admitting: Orthopaedic Surgery

## 2021-12-10 DIAGNOSIS — G8929 Other chronic pain: Secondary | ICD-10-CM

## 2021-12-10 DIAGNOSIS — M79604 Pain in right leg: Secondary | ICD-10-CM

## 2022-07-05 ENCOUNTER — Encounter (HOSPITAL_COMMUNITY): Payer: Self-pay | Admitting: *Deleted

## 2024-03-17 ENCOUNTER — Ambulatory Visit: Admitting: Physician Assistant
